# Patient Record
Sex: Male | Born: 1955 | Race: White | Hispanic: No | Marital: Married | Smoking: Former smoker
Health system: Southern US, Community
[De-identification: ages and names within clinical notes are randomized; demographics above are authoritative.]

## PROBLEM LIST (undated history)

## (undated) DIAGNOSIS — J449 Chronic obstructive pulmonary disease, unspecified: Secondary | ICD-10-CM

## (undated) DIAGNOSIS — M109 Gout, unspecified: Secondary | ICD-10-CM

## (undated) DIAGNOSIS — D869 Sarcoidosis, unspecified: Secondary | ICD-10-CM

## (undated) DIAGNOSIS — N200 Calculus of kidney: Secondary | ICD-10-CM

## (undated) DIAGNOSIS — E785 Hyperlipidemia, unspecified: Secondary | ICD-10-CM

## (undated) HISTORY — PX: NISSEN FUNDOPLICATION: SHX2091

---

## 2014-06-12 ENCOUNTER — Emergency Department (HOSPITAL_COMMUNITY): Payer: Medicare Other

## 2014-06-12 ENCOUNTER — Encounter (HOSPITAL_COMMUNITY): Payer: Self-pay | Admitting: Emergency Medicine

## 2014-06-12 ENCOUNTER — Inpatient Hospital Stay (HOSPITAL_COMMUNITY)
Admission: EM | Admit: 2014-06-12 | Discharge: 2014-06-14 | DRG: 280 | Disposition: A | Discharge status: Home / Self Care | Payer: Medicare Other | Attending: Critical Care Medicine | Admitting: Critical Care Medicine

## 2014-06-12 DIAGNOSIS — R7989 Other specified abnormal findings of blood chemistry: Secondary | ICD-10-CM

## 2014-06-12 DIAGNOSIS — M109 Gout, unspecified: Secondary | ICD-10-CM | POA: Diagnosis present

## 2014-06-12 DIAGNOSIS — J9851 Mediastinitis: Secondary | ICD-10-CM | POA: Diagnosis present

## 2014-06-12 DIAGNOSIS — I248 Other forms of acute ischemic heart disease: Secondary | ICD-10-CM | POA: Diagnosis not present

## 2014-06-12 DIAGNOSIS — J9600 Acute respiratory failure, unspecified whether with hypoxia or hypercapnia: Secondary | ICD-10-CM | POA: Diagnosis present

## 2014-06-12 DIAGNOSIS — J9601 Acute respiratory failure with hypoxia: Secondary | ICD-10-CM

## 2014-06-12 DIAGNOSIS — D869 Sarcoidosis, unspecified: Secondary | ICD-10-CM | POA: Diagnosis not present

## 2014-06-12 DIAGNOSIS — R079 Chest pain, unspecified: Secondary | ICD-10-CM

## 2014-06-12 DIAGNOSIS — R778 Other specified abnormalities of plasma proteins: Secondary | ICD-10-CM | POA: Diagnosis present

## 2014-06-12 DIAGNOSIS — Z23 Encounter for immunization: Secondary | ICD-10-CM

## 2014-06-12 DIAGNOSIS — I4510 Unspecified right bundle-branch block: Secondary | ICD-10-CM | POA: Diagnosis not present

## 2014-06-12 DIAGNOSIS — E785 Hyperlipidemia, unspecified: Secondary | ICD-10-CM | POA: Diagnosis not present

## 2014-06-12 DIAGNOSIS — K219 Gastro-esophageal reflux disease without esophagitis: Secondary | ICD-10-CM | POA: Diagnosis present

## 2014-06-12 DIAGNOSIS — Z79899 Other long term (current) drug therapy: Secondary | ICD-10-CM | POA: Diagnosis not present

## 2014-06-12 DIAGNOSIS — I10 Essential (primary) hypertension: Secondary | ICD-10-CM | POA: Diagnosis not present

## 2014-06-12 DIAGNOSIS — R0602 Shortness of breath: Secondary | ICD-10-CM | POA: Diagnosis present

## 2014-06-12 DIAGNOSIS — K920 Hematemesis: Secondary | ICD-10-CM | POA: Diagnosis present

## 2014-06-12 DIAGNOSIS — I2489 Other forms of acute ischemic heart disease: Secondary | ICD-10-CM | POA: Diagnosis present

## 2014-06-12 DIAGNOSIS — M549 Dorsalgia, unspecified: Secondary | ICD-10-CM | POA: Diagnosis not present

## 2014-06-12 DIAGNOSIS — I214 Non-ST elevation (NSTEMI) myocardial infarction: Principal | ICD-10-CM | POA: Diagnosis present

## 2014-06-12 DIAGNOSIS — R042 Hemoptysis: Secondary | ICD-10-CM

## 2014-06-12 DIAGNOSIS — G8929 Other chronic pain: Secondary | ICD-10-CM | POA: Diagnosis not present

## 2014-06-12 DIAGNOSIS — Z87891 Personal history of nicotine dependence: Secondary | ICD-10-CM | POA: Diagnosis not present

## 2014-06-12 DIAGNOSIS — R112 Nausea with vomiting, unspecified: Secondary | ICD-10-CM | POA: Diagnosis present

## 2014-06-12 DIAGNOSIS — R072 Precordial pain: Secondary | ICD-10-CM

## 2014-06-12 DIAGNOSIS — J479 Bronchiectasis, uncomplicated: Secondary | ICD-10-CM | POA: Diagnosis not present

## 2014-06-12 DIAGNOSIS — J441 Chronic obstructive pulmonary disease with (acute) exacerbation: Secondary | ICD-10-CM | POA: Diagnosis present

## 2014-06-12 HISTORY — DX: Chronic obstructive pulmonary disease, unspecified: J44.9

## 2014-06-12 HISTORY — DX: Calculus of kidney: N20.0

## 2014-06-12 HISTORY — DX: Hyperlipidemia, unspecified: E78.5

## 2014-06-12 HISTORY — DX: Gout, unspecified: M10.9

## 2014-06-12 HISTORY — DX: Sarcoidosis, unspecified: D86.9

## 2014-06-12 LAB — CK TOTAL AND CKMB (NOT AT ARMC)
CK, MB: 4.2 ng/mL — ABNORMAL HIGH (ref 0.3–4.0)
RELATIVE INDEX: INVALID (ref 0.0–2.5)
Total CK: 71 U/L (ref 7–232)

## 2014-06-12 LAB — I-STAT TROPONIN, ED: Troponin i, poc: 0.24 ng/mL (ref 0.00–0.08)

## 2014-06-12 LAB — CBC
HCT: 41.9 % (ref 39.0–52.0)
Hemoglobin: 14 g/dL (ref 13.0–17.0)
MCH: 26.6 pg (ref 26.0–34.0)
MCHC: 33.4 g/dL (ref 30.0–36.0)
MCV: 79.7 fL (ref 78.0–100.0)
Platelets: 249 10*3/uL (ref 150–400)
RBC: 5.26 MIL/uL (ref 4.22–5.81)
RDW: 13.5 % (ref 11.5–15.5)
WBC: 7.5 10*3/uL (ref 4.0–10.5)

## 2014-06-12 LAB — BASIC METABOLIC PANEL
Anion gap: 14 (ref 5–15)
BUN: 13 mg/dL (ref 6–23)
CO2: 27 mEq/L (ref 19–32)
CREATININE: 1.41 mg/dL — AB (ref 0.50–1.35)
Calcium: 9.9 mg/dL (ref 8.4–10.5)
Chloride: 100 mEq/L (ref 96–112)
GFR calc Af Amer: 63 mL/min — ABNORMAL LOW (ref >90)
GFR calc non Af Amer: 55 mL/min — ABNORMAL LOW (ref >90)
Glucose, Bld: 169 mg/dL — ABNORMAL HIGH (ref 70–99)
POTASSIUM: 3.9 meq/L (ref 3.7–5.3)
Sodium: 141 mEq/L (ref 137–147)

## 2014-06-12 LAB — TROPONIN I
Troponin I: 0.83 ng/mL (ref <0.30)
Troponin I: 2.11 ng/mL (ref <0.30)

## 2014-06-12 MED ORDER — IPRATROPIUM-ALBUTEROL 0.5-2.5 (3) MG/3ML IN SOLN
3.0000 mL | Freq: Once | RESPIRATORY_TRACT | Status: AC
  Administered 2014-06-12: 3 mL via RESPIRATORY_TRACT
  Filled 2014-06-12: qty 3

## 2014-06-12 MED ORDER — ACETAMINOPHEN 325 MG PO TABS
650.0000 mg | ORAL_TABLET | Freq: Once | ORAL | Status: AC
  Administered 2014-06-12: 650 mg via ORAL
  Filled 2014-06-12: qty 2

## 2014-06-12 MED ORDER — PANTOPRAZOLE SODIUM 40 MG IV SOLR
40.0000 mg | Freq: Two times a day (BID) | INTRAVENOUS | Status: DC
  Administered 2014-06-12 – 2014-06-14 (×4): 40 mg via INTRAVENOUS
  Filled 2014-06-12 (×5): qty 40

## 2014-06-12 MED ORDER — METHYLPREDNISOLONE SODIUM SUCC 125 MG IJ SOLR
60.0000 mg | Freq: Three times a day (TID) | INTRAMUSCULAR | Status: DC
  Administered 2014-06-12: 60 mg via INTRAVENOUS
  Filled 2014-06-12: qty 2

## 2014-06-12 MED ORDER — IOHEXOL 350 MG/ML SOLN
100.0000 mL | Freq: Once | INTRAVENOUS | Status: AC | PRN
  Administered 2014-06-12: 100 mL via INTRAVENOUS

## 2014-06-12 MED ORDER — ASPIRIN 81 MG PO CHEW
CHEWABLE_TABLET | ORAL | Status: AC
  Filled 2014-06-12: qty 4

## 2014-06-12 MED ORDER — NITROGLYCERIN IN D5W 200-5 MCG/ML-% IV SOLN
10.0000 ug/min | INTRAVENOUS | Status: DC
  Administered 2014-06-12: 5 ug/min via INTRAVENOUS

## 2014-06-12 MED ORDER — LEVOFLOXACIN 750 MG PO TABS: 750.0000 mg | ORAL_TABLET | Freq: Every day | ORAL | Status: DC

## 2014-06-12 MED ORDER — ASPIRIN 325 MG PO TABS
325.0000 mg | ORAL_TABLET | Freq: Once | ORAL | Status: AC
  Administered 2014-06-12: 325 mg via ORAL
  Filled 2014-06-12: qty 1

## 2014-06-12 MED ORDER — LEVOFLOXACIN 750 MG PO TABS
750.0000 mg | ORAL_TABLET | Freq: Once | ORAL | Status: DC
  Filled 2014-06-12: qty 1

## 2014-06-12 MED ORDER — NITROGLYCERIN 0.4 MG SL SUBL
0.4000 mg | SUBLINGUAL_TABLET | SUBLINGUAL | Status: AC | PRN
  Administered 2014-06-12 (×3): 0.4 mg via SUBLINGUAL
  Filled 2014-06-12 (×3): qty 1

## 2014-06-12 MED ORDER — NITROGLYCERIN IN D5W 200-5 MCG/ML-% IV SOLN
10.0000 ug/min | Freq: Once | INTRAVENOUS | Status: DC
  Filled 2014-06-12: qty 250

## 2014-06-12 NOTE — ED Notes (Signed)
X-ray notified of priority for this pt.

## 2014-06-12 NOTE — ED Notes (Addendum)
CODE STEMI CALLED. 

## 2014-06-12 NOTE — ED Notes (Signed)
Internal medicine at bedside

## 2014-06-12 NOTE — ED Notes (Addendum)
Cath pads placed, pt on zoll

## 2014-06-12 NOTE — ED Provider Notes (Signed)
Medical screening examination/treatment/procedure(s) were conducted as a shared visit with non-physician practitioner(s) and myself.  I personally evaluated the patient during the encounter.   EKG Interpretation   Date/Time:  Saturday June 12 2014 15:17:03 EDT Ventricular Rate:  106 PR Interval:  161 QRS Duration: 128 QT Interval:  348 QTC Calculation: 462 R Axis:   -113 Text Interpretation:  Sinus tachycardia RBBB and LAFB Inferior infarct,  acute Lateral leads are also involved No previous ECGs available except  for the one done earlier just now Confirmed by Platon Arocho  MD, Sheehan Stacey  [phone]) on 06/12/2014 3:21:43 PM      Results for orders placed during the hospital encounter of 06/12/14  CBC      Result Value Ref Range   WBC 7.5  4.0 - 10.5 K/uL   RBC 5.26  4.22 - 5.81 MIL/uL   Hemoglobin 14.0  13.0 - 17.0 g/dL   HCT 30.8  65.7 - 84.6 %   MCV 79.7  78.0 - 100.0 fL   MCH 26.6  26.0 - 34.0 pg   MCHC 33.4  30.0 - 36.0 g/dL   RDW 96.2  95.2 - 84.1 %   Platelets 249  150 - 400 K/uL  I-STAT TROPOININ, ED      Result Value Ref Range   Troponin i, poc 0.24 (*) 0.00 - 0.08 ng/mL   Comment NOTIFIED PHYSICIAN     Comment 3            CRITICAL CARE Performed by: Siddhartha Hoback Total critical care time: 30 Critical care time was exclusive of separately billable procedures and treating other patients. Critical care was necessary to treat or prevent imminent or life-threatening deterioration. Critical care was time spent personally by me on the following activities: development of treatment plan with patient and/or surrogate as well as nursing, discussions with consultants, evaluation of patient's response to treatment, examination of patient, obtaining history from patient or surrogate, ordering and performing treatments and interventions, ordering and review of laboratory studies, ordering and review of radiographic studies, pulse oximetry and re-evaluation of patient's  condition.  Patient brought in by EMS. Sudden episode acute onset of vomiting some blood small pool since spots of blood. And then very shortly after that within seconds.right-sided chest pain. Patient continued on do do some vomiting and lots of shortness of breath with it. EKG upon arrival was concerning for ST segment elevation consistent with an MI in lead 1 V2. ST segment depression in V3 only. Patient's initial point-of-care troponin elevated. Patient's chest pain improved with nitroglycerin.  Chest x-ray portable markedly abnormal on reviewed by [place]. We asked radiology to take a look at the chest x-ray. 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 Patient has been followed at [facility] for this in the past and has had surgery done there. Patient also reports having a cardiac cath about 2 years ago go to have no significant coronary disease.  Based on the persistent elevation of V1 V2 code STEMI was initiated discussed with cardiology to do his bleeding they did not feel comfortable going to the Cath Lab in addition the findings and V1 V2 were a little bit soft. They are aware that his troponin was elevated.  Based on the chest x-ray findings we'll induce CT chest to further evaluate the lung findings.  Vanetta Mulders, MD 06/12/14 1623

## 2014-06-12 NOTE — H&P (Addendum)
Patient's PCP: [facility] Complaint: Nausea, vomiting, coughing blood, chest pain, and shortness of breath  History of Present Illness: Tony Wise is a 58 y.o. Caucasian male male with history of sarcoidosis, COPD, GERD, nephrolithiasis, and gout who presents with the above complaints.  Patient gets most of his care at [facility], he lives in [place], [place]. He came to [place], [place] to shop.  At 2:15 - 2:30 PM, he had a sudden episode of nausea and vomiting. He did not expel his emesis and attempted to swallow it.  Then he had a 5-10 min episode of sever cough and short of breath. During this episode he started coughing bright red blood, which patient reports that it was almost a liter.  He also started having chest pain.  His wife called 911 and he was brought to the emergency department by EMS.  Initially his troponin was elevated with questionable ST changes, he was started on IV nitroglycerin.  His chest pain improved somewhat with nitroglycerin.  CT of the chest with contrast was done which was negative for pulmonary embolism.  Initially a code STEMI was activated, case was discussed with Dr. Eldridge Dace and it was felt that patient was not a candidate for emergent cardiac catheter due to non-convincing EKG changes, hemoptysis and possible hematemesis.  Hospitalist service was asked to admit the patient for further care and management.  He denies any fevers but reports having chills and night sweats at home.  Denies any abdominal pain, or diarrhea.  Does have chronic loose stools.  Denies any headaches.  Review of Systems: All systems reviewed with the patient and positive as per history of present illness, otherwise all other systems are negative.  Past Medical History  Diagnosis Date  . COPD (chronic obstructive pulmonary disease)   . Hyperlipidemia   . Sarcoidosis   . Gout    History reviewed. No pertinent past surgical history. Family History  Problem Relation  Age of Onset  . Diabetes Mother   . Aneurysm Father   . Other Father     Black lung   History   Social History  . Marital Status: Married    Spouse Name: N/A    Number of Children: N/A  . Years of Education: N/A   Occupational History  . Not on file.   Social History Main Topics  . Smoking status: Former Smoker    Quit date: 06/12/2006  . Smokeless tobacco: Not on file  . Alcohol Use: Not on file  . Drug Use: No  . Sexual Activity: Not on file   Other Topics Concern  . Not on file   Social History Narrative  . No narrative on file   Allergies: Review of patient's allergies indicates no known allergies.  Home Meds: Prior to Admission medications   Medication Sig Start Date End Date Taking? Authorizing Provider  albuterol (PROVENTIL) (2.5 MG/3ML) 0.083% nebulizer solution Take 2.5 mg by nebulization every 6 (six) hours as needed for wheezing or shortness of breath.   Yes Historical Provider, MD  albuterol-ipratropium (COMBIVENT) 18-103 MCG/ACT inhaler Inhale 1 puff into the lungs every 6 (six) hours as needed for wheezing or shortness of breath.   Yes Historical Provider, MD  fluticasone (FLONASE) 50 MCG/ACT nasal spray Place 1 spray into both nostrils daily.   Yes Historical Provider, MD  lisinopril (PRINIVIL,ZESTRIL) 10 MG tablet Take 5 mg by mouth daily.   Yes Historical Provider, MD  Multiple Vitamins-Minerals (MULTIVITAMIN PO) Take 1  tablet by mouth daily.   Yes Historical Provider, MD    Physical Exam: Blood pressure 104/71, pulse 69, temperature 97.8 F (36.6 C), temperature source Oral, resp. rate 16, SpO2 96.00%. General: Awake, Oriented x3, No acute distress. HEENT: EOMI, Moist mucous membranes Neck: Supple CV: S1 and S2, some reproducible chest pain on palpation over the sternum Lungs: Moderate air movement, inspiratory and expiratory wheezing bilaterally. Abdomen: Soft, Nontender, Nondistended, +bowel sounds. Ext: Good pulses. Trace edema. No clubbing or  cyanosis noted. Neuro: Cranial Nerves II-XII grossly intact. Has 5/5 motor strength in upper and lower extremities.  Lab results:  Recent Labs  06/12/14 1530  NA 141  K 3.9  CL 100  CO2 27  GLUCOSE 169*  BUN 13  CREATININE 1.41*  CALCIUM 9.9   No results found for this basename: AST, ALT, ALKPHOS, BILITOT, PROT, ALBUMIN,  in the last 72 hours No results found for this basename: LIPASE, AMYLASE,  in the last 72 hours  Recent Labs  06/12/14 1530  WBC 7.5  HGB 14.0  HCT 41.9  MCV 79.7  PLT 249    Recent Labs  06/12/14 1530 06/12/14 1917  CKTOTAL  --  71  CKMB  --  4.2*  TROPONINI 0.83* 2.11*   No components found with this basename: POCBNP,  No results found for this basename: DDIMER,  in the last 72 hours No results found for this basename: HGBA1C,  in the last 72 hours No results found for this basename: CHOL, HDL, LDLCALC, TRIG, CHOLHDL, LDLDIRECT,  in the last 72 hours No results found for this basename: TSH, T4TOTAL, FREET3, T3FREE, THYROIDAB,  in the last 72 hours No results found for this basename: VITAMINB12, FOLATE, FERRITIN, TIBC, IRON, RETICCTPCT,  in the last 72 hours Imaging results:  Ct Chest Wo Contrast  06/12/2014   CLINICAL DATA:  Chest pain. Shortness of breath. Hemoptysis. Abnormal chest x-ray earlier today.  EXAM: CT CHEST WITHOUT CONTRAST  TECHNIQUE: Multidetector CT imaging of the chest was performed following the standard protocol without IV contrast.  COMPARISON:  No prior CT.  Portable chest x-ray earlier same date.  FINDINGS: Innumerable nodules throughout both lungs. Bronchiectasis and scarring associated with parenchymal calcification involving both upper lobes, accounting for the opacity on the chest x-ray. Emphysematous changes in both lungs. Small left pleural effusion. Enlarged calcified lymph nodes throughout the mediastinum and in both hila, the largest conglomerate nodes measuring approximately 3.2 x 6.1 cm in the subcarinal region  (station 7).  Normal heart size. Mild LAD and left circumflex coronary atherosclerosis. Mild atherosclerosis involving the thoracic and upper abdominal aorta without aneurysm.  Bone window images demonstrate spondylosis at T8-9 and perhaps mild osseous demineralization. Visualized upper abdomen unremarkable for the unenhanced technique.  IMPRESSION: 1. 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 This is typically seen in patients with dust inhalational pneumoconioses such as coal workers pneumoconiosis, silicosis and occasionally talcosis. 2. Mediastinal and bilateral hilar lymphadenopathy with calcifications as part of the same pathologic process.   Electronically Signed   By: Hulan Saas M.D.   On: 06/12/2014 17:01   Ct Angio Chest W/cm &/or Wo Cm  06/12/2014   CLINICAL DATA:  Sudden onset of nausea. Vomiting bright red blood. Chest pain. History of sarcoidosis and COPD.  EXAM: CT ANGIOGRAPHY CHEST WITH CONTRAST  TECHNIQUE: Multidetector CT imaging of the chest  was performed using the standard protocol during bolus administration of intravenous contrast. Multiplanar CT image reconstructions and MIPs were obtained to evaluate the vascular anatomy.  CONTRAST:  OMNIPAQUE IOHEXOL 350 MG/ML SOLN  COMPARISON:  06/12/2014 at 1642 hr.  FINDINGS: No evidence of a pulmonary embolus.  Heart is normal in size.  Minor coronary artery calcifications.  There is bulky partly calcified mediastinal adenopathy as well as bulky hilar adenopathy also partly calcified. Hilar adenopathy is contiguous with progressive massive fibrosis and associated calcifications in both upper lobes.  There are numerous small pulmonary nodules many with calcifications.  Bulky fibrosis narrows the upper lobe pulmonary arteries and bronchi. Significant fibrosis and  volume loss is noted in the right middle lobe, also with areas of bronchial narrowing.  No pleural effusion.  No pneumothorax.  Limited evaluation of the upper abdomen is unremarkable.  No osteoblastic or osteolytic lesions.  Review of the MIP images confirms the above findings.  IMPRESSION: 1. No evidence of a pulmonary embolism. 2. As seen on the study obtained earlier the same date, there is progressive massive fibrosis, numerous pulmonary nodules, bulky mediastinal and hilar adenopathy, with scattered calcifications noted throughout the nodes in pulmonary nodules and upper lobe fibrosis. Although the history provided indicates sarcoidosis, other etiologies as mentioned in the early report including silicosis should be considered. 3. There are areas of bronchial and pulmonary artery narrowing mostly in the upper lobes. The possibility of the fibrosis eroding into a bronchus in leading to bleeding should be considered given the history.   Electronically Signed   By: Amie Portland M.D.   On: 06/12/2014 19:54   Dg Chest Portable 1 View  06/12/2014   CLINICAL DATA:  Right-sided chest pain and shortness of breath. Nausea. History of COPD and sarcoid.  EXAM: PORTABLE CHEST - 1 VIEW  COMPARISON:  None.  FINDINGS: Cardiac and mediastinal contours are largely obscured due to overlying opacities. Upper retraction of the hila bilaterally. There is large consolidative opacity within the right greater than left upper lungs. Diffuse bilateral scattered nodules. No pleural effusion. No definite pneumothorax. Low lung volumes. Regional skeleton is unremarkable. Relative lucency under left hemidiaphragm.  IMPRESSION: Right-greater-than-left upper lung large consolidative pulmonary opacities as well as diffuse bilateral pulmonary nodules. In the absence of priors for comparison, findings may be secondary to sarcoidosis and associated progressive massive fibrosis. Underlying pulmonary malignancy or superimposed infectious  process are additional considerations. Correlation with outside priors would prove helpful.  Small right pleural effusion versus thickening.  Relative lucency under left hemidiaphragm favored to represent gas within the colon. If there is concern for intra-abdominal pathology, can correlate with decubitus views to exclude free air.  These results were called by telephone at the time of interpretation on 06/12/2014 at 4:20 pm to [facility] , who verbally acknowledged these results.   Electronically Signed   By: Annia Belt M.D.   On: 06/12/2014 16:24    Assessment & Plan by Problem: Elevated troponin Unclear etiology.  Cardiology not convinced that patient has acute coronary syndrome, even though troponin is elevated creatinine kinase is normal.  Case discussed with Dr. Mayford Knife, cardiology.  Patient at this time is not a candidate for anticoagulation given hemoptysis and possibly hematemesis.  Cardiology following, appreciate their input.  Requested echocardiogram in the morning.  Patient received a dose of aspirin in the emergency department.  Given hemoptysis, hold aspirin for now.  Continue to cycle troponin and CK.  Check lipid panel and hemoglobin A1c  in the morning to risk stratify the patient.  Acute respiratory failure with COPD exacerbation Start Solu-Medrol 60 mg every 8 hours IV.  Start levofloxacin for COPD exacerbation and also to cover for aspiration pneumonitis.  Continue nebulizer therapy scheduled and as needed.  Chest pain Monitor patient on telemetry.  Cycle cardiac enzymes.  Hemoptysis CT was done which was negative for pulmonary embolism.  Pulmonary was consulted for further care and management.  Cycle CBC.  No further episodes of bleeding.  Possible hematemesis N.p.o.  Cycle CBC.  Start pantoprazole 40 mg IV twice daily.  GERD Continue PPI.  Hypertension Continue lisinopril with hold parameters.  Prophylaxis SCDs.  CODE STATUS Full code.  Disposition Admit the  patient to step down for close observation on telemetry.  Time spent on admission, talking to the patient, and coordinating care was: 50 mins.  Tyke Outman A, MD 06/12/2014, 10:10 PM  Discussed with critical care service.  They wish to see him patient's care and take the patient to the ICU for closer monitoring.  Case discussed with Dr. Kem Kays, critical care.  Janeil Schexnayder A, MD 06/12/2014, 11:32 PM

## 2014-06-12 NOTE — ED Provider Notes (Signed)
CSN: [id number]     Arrival date & time 06/12/14  1500 History   First MD Initiated Contact with Patient 06/12/14 1502     Chief Complaint  Patient presents with  . Chest Pain  . Shortness of Breath     (Consider location/radiation/quality/duration/timing/severity/associated sxs/prior Treatment) HPI Comments: Pt is a 58 y/o male with a PMHx of sarcoidosis, COPD and hyperlipidemia who presents to the ED via EMS complaining of sudden onset nausea, vomiting, chest pain and sob beginning about 1 hour prior to arrival when he was walking in the store. Pt reports he was feeling fine, and he suddenly began to feel nauseated, immediately vomited BRB, and then developed right sided chest pain with associated left shoulder pain and shortness of breath within seconds. EMS arrived and noted some wheezing and gave him a neb treatment. Pt denies hx of hematemesis, however states in his prior surgeries (hx of lung surgeries) he was told he was a difficult intubation. Denies ever having chest pain like this in the past. No other medications given via EMS. Denies numbness or weakness, lightheadedness, dizziness, abdominal pain. EKG on arrival showed possible elevated ST inferiorly and laterally. Pt had normal cardiac cath 2 years ago. No hx of MI. Denies hx of esophageal varices or GI bleeding.  Patient is a 58 y.o. male presenting with chest pain and shortness of breath. The history is provided by the patient and the EMS personnel.  Chest Pain Associated symptoms: nausea, shortness of breath and vomiting   Shortness of Breath Associated symptoms: chest pain and vomiting     Past Medical History  Diagnosis Date  . COPD (chronic obstructive pulmonary disease)   . Hyperlipidemia   . Sarcoidosis   . Gout    History reviewed. No pertinent past surgical history. Family History  Problem Relation Age of Onset  . Diabetes Mother   . Aneurysm Father   . Other Father     Black lung   History  Substance Use  Topics  . Smoking status: Former Smoker    Quit date: 06/12/2006  . Smokeless tobacco: Not on file  . Alcohol Use: Not on file    Review of Systems  Respiratory: Positive for shortness of breath.   Cardiovascular: Positive for chest pain.  Gastrointestinal: Positive for nausea and vomiting.  All other systems reviewed and are negative.     Allergies  Review of patient's allergies indicates no known allergies.  Home Medications   Prior to Admission medications   Medication Sig Start Date End Date Taking? Authorizing Provider  albuterol (PROVENTIL) (2.5 MG/3ML) 0.083% nebulizer solution Take 2.5 mg by nebulization every 6 (six) hours as needed for wheezing or shortness of breath.   Yes Historical Provider, MD  albuterol-ipratropium (COMBIVENT) 18-103 MCG/ACT inhaler Inhale 1 puff into the lungs every 6 (six) hours as needed for wheezing or shortness of breath.   Yes Historical Provider, MD  fluticasone (FLONASE) 50 MCG/ACT nasal spray Place 1 spray into both nostrils daily.   Yes Historical Provider, MD  lisinopril (PRINIVIL,ZESTRIL) 10 MG tablet Take 5 mg by mouth daily.   Yes Historical Provider, MD  Multiple Vitamins-Minerals (MULTIVITAMIN PO) Take 1 tablet by mouth daily.   Yes Historical Provider, MD   BP 118/69  Pulse 72  Temp(Src) 97.8 F (36.6 C) (Oral)  Resp 12  SpO2 97% Physical Exam  Nursing note and vitals reviewed. Constitutional: He is oriented to person, place, and time. He appears well-developed and well-nourished. No distress.  HENT:  Head: Normocephalic and atraumatic.  Mouth/Throat: Oropharynx is clear and moist.  Eyes: Conjunctivae and EOM are normal. Pupils are equal, round, and reactive to light.  Neck: Normal range of motion. Neck supple. No JVD present.  Cardiovascular: Regular rhythm, normal heart sounds and intact distal pulses.   Tachycardic. No extremity edema.  Pulmonary/Chest: Effort normal. No respiratory distress.  Scattered wheezes  bilateral.  Abdominal: Soft. Bowel sounds are normal. He exhibits no distension. There is no tenderness.  Musculoskeletal: Normal range of motion. He exhibits no edema.  Neurological: He is alert and oriented to person, place, and time. He has normal strength. No sensory deficit.  Speech fluent, goal oriented. Moves limbs without ataxia. Equal grip strength bilateral.  Skin: Skin is warm and dry. He is not diaphoretic.  Psychiatric: He has a normal mood and affect. His behavior is normal.    ED Course  Procedures (including critical care time)  CRITICAL CARE Performed by: Johnnette Gourd   Total critical care time: 60 minutes  Critical care time was exclusive of separately billable procedures and treating other patients.  Critical care was necessary to treat or prevent imminent or life-threatening deterioration.  Critical care was time spent personally by me on the following activities: development of treatment plan with patient and/or surrogate as well as nursing, discussions with consultants, evaluation of patient's response to treatment, examination of patient, obtaining history from patient or surrogate, ordering and performing treatments and interventions, ordering and review of laboratory studies, ordering and review of radiographic studies, pulse oximetry and re-evaluation of patient's condition.  Labs Review Labs Reviewed  BASIC METABOLIC PANEL - Abnormal; Notable for the following:    Glucose, Bld 169 (*)    Creatinine, Ser 1.41 (*)    GFR calc non Af Amer 55 (*)    GFR calc Af Amer 63 (*)    All other components within normal limits  TROPONIN I - Abnormal; Notable for the following:    Troponin I 0.83 (*)    All other components within normal limits  TROPONIN I - Abnormal; Notable for the following:    Troponin I 2.11 (*)    All other components within normal limits  CK TOTAL AND CKMB - Abnormal; Notable for the following:    CK, MB 4.2 (*)    All other components  within normal limits  I-STAT TROPOININ, ED - Abnormal; Notable for the following:    Troponin i, poc 0.24 (*)    All other components within normal limits  CBC  TROPONIN I  TROPONIN I  CK  CK    Imaging Review Ct Chest Wo Contrast  06/12/2014   CLINICAL DATA:  Chest pain. Shortness of breath. Hemoptysis. Abnormal chest x-ray earlier today.  EXAM: CT CHEST WITHOUT CONTRAST  TECHNIQUE: Multidetector CT imaging of the chest was performed following the standard protocol without IV contrast.  COMPARISON:  No prior CT.  Portable chest x-ray earlier same date.  FINDINGS: Innumerable nodules throughout both lungs. Bronchiectasis and scarring associated with parenchymal calcification involving both upper lobes, accounting for the opacity on the chest x-ray. Emphysematous changes in both lungs. Small left pleural effusion. Enlarged calcified lymph nodes throughout the mediastinum and in both hila, the largest conglomerate nodes measuring approximately 3.2 x 6.1 cm in the subcarinal region (station 7).  Normal heart size. Mild LAD and left circumflex coronary atherosclerosis. Mild atherosclerosis involving the thoracic and upper abdominal aorta without aneurysm.  Bone window images demonstrate spondylosis at T8-9 and  perhaps mild osseous demineralization. Visualized upper abdomen unremarkable for the unenhanced technique.  IMPRESSION: 1. 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 This is typically seen in patients with dust inhalational pneumoconioses such as coal workers pneumoconiosis, silicosis and occasionally talcosis. 2. Mediastinal and bilateral hilar lymphadenopathy with calcifications as part of the same pathologic process.   Electronically Signed   By: Hulan Saas M.D.   On: 06/12/2014 17:01   Ct Angio Chest W/cm &/or Wo  Cm  06/12/2014   CLINICAL DATA:  Sudden onset of nausea. Vomiting bright red blood. Chest pain. History of sarcoidosis and COPD.  EXAM: CT ANGIOGRAPHY CHEST WITH CONTRAST  TECHNIQUE: Multidetector CT imaging of the chest was performed using the standard protocol during bolus administration of intravenous contrast. Multiplanar CT image reconstructions and MIPs were obtained to evaluate the vascular anatomy.  CONTRAST:  OMNIPAQUE IOHEXOL 350 MG/ML SOLN  COMPARISON:  06/12/2014 at 1642 hr.  FINDINGS: No evidence of a pulmonary embolus.  Heart is normal in size.  Minor coronary artery calcifications.  There is bulky partly calcified mediastinal adenopathy as well as bulky hilar adenopathy also partly calcified. Hilar adenopathy is contiguous with progressive massive fibrosis and associated calcifications in both upper lobes.  There are numerous small pulmonary nodules many with calcifications.  Bulky fibrosis narrows the upper lobe pulmonary arteries and bronchi. Significant fibrosis and volume loss is noted in the right middle lobe, also with areas of bronchial narrowing.  No pleural effusion.  No pneumothorax.  Limited evaluation of the upper abdomen is unremarkable.  No osteoblastic or osteolytic lesions.  Review of the MIP images confirms the above findings.  IMPRESSION: 1. No evidence of a pulmonary embolism. 2. As seen on the study obtained earlier the same date, there is progressive massive fibrosis, numerous pulmonary nodules, bulky mediastinal and hilar adenopathy, with scattered calcifications noted throughout the nodes in pulmonary nodules and upper lobe fibrosis. Although the history provided indicates sarcoidosis, other etiologies as mentioned in the early report including silicosis should be considered. 3. There are areas of bronchial and pulmonary artery narrowing mostly in the upper lobes. The possibility of the fibrosis eroding into a bronchus in leading to bleeding should be considered given the  history.   Electronically Signed   By: Amie Portland M.D.   On: 06/12/2014 19:54   Dg Chest Portable 1 View  06/12/2014   CLINICAL DATA:  Right-sided chest pain and shortness of breath. Nausea. History of COPD and sarcoid.  EXAM: PORTABLE CHEST - 1 VIEW  COMPARISON:  None.  FINDINGS: Cardiac and mediastinal contours are largely obscured due to overlying opacities. Upper retraction of the hila bilaterally. There is large consolidative opacity within the right greater than left upper lungs. Diffuse bilateral scattered nodules. No pleural effusion. No definite pneumothorax. Low lung volumes. Regional skeleton is unremarkable. Relative lucency under left hemidiaphragm.  IMPRESSION: Right-greater-than-left upper lung large consolidative pulmonary opacities as well as diffuse bilateral pulmonary nodules. In the absence of priors for comparison, findings may be secondary to sarcoidosis and associated progressive massive fibrosis. Underlying pulmonary malignancy or superimposed infectious process are additional considerations. Correlation with outside priors would prove helpful.  Small right pleural effusion versus thickening.  Relative lucency under left hemidiaphragm favored to represent gas within the colon. If there is concern for intra-abdominal pathology, can correlate with decubitus views to exclude free  air.  These results were called by telephone at the time of interpretation on 06/12/2014 at 4:20 pm to [facility] , who verbally acknowledged these results.   Electronically Signed   By: Annia Belt M.D.   On: 06/12/2014 16:24     EKG Interpretation   Date/Time:  Saturday June 12 2014 15:17:03 EDT Ventricular Rate:  106 PR Interval:  161 QRS Duration: 128 QT Interval:  348 QTC Calculation: 462 R Axis:   -113 Text Interpretation:  Sinus tachycardia RBBB and LAFB Inferior infarct,  acute Lateral leads are also involved No previous ECGs available except  for the one done earlier just now  Confirmed by ZACKOWSKI  MD, SCOTT  [phone]) on 06/12/2014 3:21:43 PM      MDM   Final diagnoses:  NSTEMI (non-ST elevated myocardial infarction)  Hemoptysis  Shortness of breath   Pt with CP, SOB, hematemesis. Small amount of BRB in emesis bag. EKG on arrival concerning for ST elevation in lead II and V2. ST depression in lead V3. Repeat EKG unchanged. POC troponin elevated at 0.24. CODE STEMI initiated, Dr. Deretha Emory spoke with Dr. Eldridge Dace who does not feel comfortable going to cath lab given hematemesis, and elevation is not significant. Advised nitro drip. No heparin given the hematemesis. Dr. Elease Hashimoto also came to ED, does not feel this is a STEMI, advised repeat troponin (regular lab). I discussed CXR findings with radiologist. Will obtain CT chest for further evaluation. Pt received ASA and 1 SL nitro with relief from 10/10 to 3-4/10.  Chest CT showing severe progressive massive fibrosis with associated calcifications and bronchiectasis in the upper lobe, along with innumerable nodules throughout lungs. Mediastinal and bilateral hilar lymphadenopathy with calcifications as part of the same pathologic process.  Regular lab troponin elevated at 0.83. I again consult with cardiology, spoke with Dr. Mayford Knife who does not feel the elevated troponin is coming from a cardiac issue. She advised obtaining CT angiogram evaluate for PE. CT angiogram obtained, no evidence of PE. Serial troponins ordered by cardiology, results keep trending upwards, results to 0.11. I again spoke with Dr. Mayford Knife who still does not feel this is a cardiac reason for elevated troponin. Pt admitted to hospitalist, Dr. Betti Cruz to step-down.  Trevor Mace, PA-C 06/12/14 2247

## 2014-06-12 NOTE — Consult Note (Signed)
Admit date: 06/12/2014 Referring Physician  Dr. Deretha Emory Primary Cardiologist  None Reason for Consultation  Right sided pleuritic CP and elevated trop  HPI: This is a 57yo WM with a no history of CAD but a history of dyslipidemia and COPD with sarcoidosis where he is followed by [facility].  He has not had any anginal chest pain in the past but has chronic SOB from his lung disease.  He was in his [place] today out shopping when he felt nauseated and then started vomiting profusely.  He started coughing up blood and then throwing it up as well as blood coming out his nose.  He then suddenly started wheezing and could not breath.  Next he had sudden onset of sharp pleuritic chest pain much worse with deep breathing and a 10/10.  He denied any diaphoresis.  He denied any recent fever, chills or abdominal pain.  He was noted to have blood in the emesis and the patient says that he was coughing up blood as well.  He continued to have vomiting with severe SOB.  In ER he had a EKG with ? ST elevation in V2 with depression in V3 and was started on IV NTG.  His CP did improve with IV NTG.  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  The patient admits to cardiac cath 2 years ago that was normal.  Due to ST elevation in V1 and V2 a code STEMI was initiated and case discussed with Dr. Eldridge Dace and felt not to be a candidate for emergent cath due to hemoptysis and hematemesis.  Ct showed mild LAD and left circumflex coronary atherosclerosis and mild atherosclerosis involving the thoracic and upper abdominal aorta without aneurysm.  There was severe progressive massive fibrosis (PMF) with associated calcifications and bronchiectasis in the upper lobes, along with innumerable nodules throughout both lungs, accounting for the opacities on the earlier chest x-ray. Initial serum troponin was .83 and POC trop .24.  Cardiology is now asked to consult.  The patient still has 2/10 CP that is  right sided and only occurs with breathing.     PMH:   Past Medical History  Diagnosis Date  . COPD (chronic obstructive pulmonary disease)   . Hyperlipidemia      PSH:  History reviewed. No pertinent past surgical history.  Allergies:  Review of patient's allergies indicates no known allergies. Prior to Admit Meds:   (Not in a hospital admission) Fam HX:   History reviewed. No pertinent family history. Social HX:    History   Social History  . Marital Status: Married    Spouse Name: N/A    Number of Children: N/A  . Years of Education: N/A   Occupational History  . Not on file.   Social History Main Topics  . Smoking status: Not on file  . Smokeless tobacco: Not on file  . Alcohol Use: Not on file  . Drug Use: Not on file  . Sexual Activity: Not on file   Other Topics Concern  . Not on file   Social History Narrative  . No narrative on file     ROS:  All 11 ROS were addressed and are negative except what is stated in the HPI  Physical Exam: Blood pressure 99/71, pulse 79, temperature 97.8 F (36.6 C), temperature source Oral, resp. rate 12, SpO2 99.00%.    General: Well developed, well nourished, in no acute distress Head: Eyes PERRLA, No xanthomas.  Normal cephalic and atramatic  Lungs:   Severe expiratory wheezes and rhonchi throughout Heart:   HRRR S1 S2 Pulses are 2+ & equal.            No carotid bruit. No JVD.  No abdominal bruits. No femoral bruits. Abdomen: Bowel sounds are positive, abdomen soft and non-tender without masses Extremities:   No clubbing, cyanosis or edema.  DP +1 Neuro: Alert and oriented X 3. Psych:  Good affect, responds appropriately    Labs:   Lab Results  Component Value Date   WBC 7.5 06/12/2014   HGB 14.0 06/12/2014   HCT 41.9 06/12/2014   MCV 79.7 06/12/2014   PLT 249 06/12/2014    Recent Labs Lab 06/12/14 1530  NA 141  K 3.9  CL 100  CO2 27  BUN 13  CREATININE 1.41*  CALCIUM 9.9  GLUCOSE 169*   No results  found for this basename: PTT   No results found for this basename: INR, PROTIME   Lab Results  Component Value Date   TROPONINI 0.83* 06/12/2014     No results found for this basename: CHOL   No results found for this basename: HDL   No results found for this basename: LDLCALC   No results found for this basename: TRIG   No results found for this basename: CHOLHDL   No results found for this basename: LDLDIRECT      Radiology:  Ct Chest Wo Contrast  06/12/2014   CLINICAL DATA:  Chest pain. Shortness of breath. Hemoptysis. Abnormal chest x-ray earlier today.  EXAM: CT CHEST WITHOUT CONTRAST  TECHNIQUE: Multidetector CT imaging of the chest was performed following the standard protocol without IV contrast.  COMPARISON:  No prior CT.  Portable chest x-ray earlier same date.  FINDINGS: Innumerable nodules throughout both lungs. Bronchiectasis and scarring associated with parenchymal calcification involving both upper lobes, accounting for the opacity on the chest x-ray. Emphysematous changes in both lungs. Small left pleural effusion. Enlarged calcified lymph nodes throughout the mediastinum and in both hila, the largest conglomerate nodes measuring approximately 3.2 x 6.1 cm in the subcarinal region (station 7).  Normal heart size. Mild LAD and left circumflex coronary atherosclerosis. Mild atherosclerosis involving the thoracic and upper abdominal aorta without aneurysm.  Bone window images demonstrate spondylosis at T8-9 and perhaps mild osseous demineralization. Visualized upper abdomen unremarkable for the unenhanced technique.  IMPRESSION: 1. 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 This is typically seen in patients with dust inhalational pneumoconioses such as coal workers pneumoconiosis, silicosis and  occasionally talcosis. 2. Mediastinal and bilateral hilar lymphadenopathy with calcifications as part of the same pathologic process.   Electronically Signed   By: Hulan Saas M.D.   On: 06/12/2014 17:01   Dg Chest Portable 1 View  06/12/2014   CLINICAL DATA:  Right-sided chest pain and shortness of breath. Nausea. History of COPD and sarcoid.  EXAM: PORTABLE CHEST - 1 VIEW  COMPARISON:  None.  FINDINGS: Cardiac and mediastinal contours are largely obscured due to overlying opacities. Upper retraction of the hila bilaterally. There is large consolidative opacity within the right greater than left upper lungs. Diffuse bilateral scattered nodules. No pleural effusion. No definite pneumothorax. Low lung volumes. Regional skeleton is unremarkable. Relative lucency under left hemidiaphragm.  IMPRESSION: Right-greater-than-left upper lung large consolidative pulmonary opacities as well as diffuse bilateral  pulmonary nodules. In the absence of priors for comparison, findings may be secondary to sarcoidosis and associated progressive massive fibrosis. Underlying pulmonary malignancy or superimposed infectious process are additional considerations. Correlation with outside priors would prove helpful.  Small right pleural effusion versus thickening.  Relative lucency under left hemidiaphragm favored to represent gas within the colon. If there is concern for intra-abdominal pathology, can correlate with decubitus views to exclude free air.  These results were called by telephone at the time of interpretation on 06/12/2014 at 4:20 pm to [facility] , who verbally acknowledged these results.   Electronically Signed   By: Annia Belt M.D.   On: 06/12/2014 16:24    EKG:  NSR with RBBB and no ST elevation, slight ST depression in V3  ASSESSMENT:  1.  Elevated troponin which is minimally elevated and most likely nonspecific elevation from severe vomiting.  He does have mild coronary artery calcifications on chest CT  but he has never had chest pain and had a cath 2 years ago that was normal.  I have personally reviewed the EKG and it shows RBBB with no ST elevation to indicate acute MI.  His pain is very atypical in that it is pleuritic and occurs mainly with deep breathing.  It started after a severe coughing fit after vomiting.  I suspect he aspirated some when he vomited triggering the severe coughing and chest pain.   2.  Atypical chest pain - as stated above.  I do not think this is an acute coronary syndrome. 3.  Severe COPD with ? End stage sarcoidosis followed at [facility] - exam shows severe wheezing and rhonchi throughout 4.  Dyslipidemia.  PLAN:   1. Continue to cycle cardiac enzymes and check CPK and MB 2. No anticoagulation with heparin or ASA at this time due to hemoptysis and hematemesis. 3.  Consider chest CT with contrast to rule out PE 4.  No BB secondary to severe wheezing 5.  Need to determine bleeding source (GI vs. Pulmonary) 4.  Will follow with you  Quintella Reichert, MD  06/12/2014  6:45 PM

## 2014-06-12 NOTE — ED Notes (Signed)
RN to escort pt to CT.

## 2014-06-12 NOTE — Consult Note (Addendum)
PULMONARY  / CRITICAL CARE MEDICINE CONSULTATION  Name: Tony Wise MRN: [id number] DOB: [birth year]  PHYSICIAN REQUESTING CONSULT: Andreas Blower  REASON FOR CONSULT: hemoptysis and sarcoidosis    ADMISSION DATE:  06/12/2014  CHIEF COMPLAINT:  Coughing up blood  BRIEF PATIENT DESCRIPTION:  58 year old man with a long history of sarcoidosis presents with large volume hemoptysis.   SIGNIFICANT EVENTS / STUDIES:  1. CT chest shows no PE, does show massive fibrosis and narrowing of PAs and bronchial arteries  LINES / TUBES: 1. PIV  CULTURES: 1. None  ANTIBIOTICS: 1. None  HISTORY OF PRESENT ILLNESS:   Tony Wise is a 58 year old caucasian man with a longstanding history of sarcoidosis and COPD, followed at [facility], who presents with large volume hemoptysis.  He was shopping today when he suddenly developed cough and nausea, and felt like he had to throw up.  He began coughing and ?vomiting up blood, which he estimates to be a liter.  He describes it as bright red blood.  This has never happened to him before.  EMS brought him to the hospital, where he has had no further events.  At the time, he was breathless, wheezing, and coughing.  He felt like his chest was tight and hurting.  He denies abdominal pain, lower extremity/abdominal swelling, diarrhea, bloody stools, nosebleeds.  He has not had any fainting spells but has had some dizziness.  For the past few months, he has noticed progressive dyspnea, which now limits his ability to even walk around the house.  He has a dry cough and hears wheezing coming from his throat and his lungs.  At night he feels like "mucus gets stuck in my throat."  He also has significant reflux.  His last visit with [facility] was in March.  At that time he was started on plaquenil.  His PFTs in March included an FVC of 2.44, 52% of predicted, an FEV1 of 1.27, 34% of predicted, and a DLCO of 44% predicted. At that time, he did not need O2 on his walk. He says  that he has considered and decided against pursuing lung transplant.  He has a headaches that come and go, last about 3 minutes, and chronic back pain.  He has no rashes and no joint involvement.  His last steroid burst was several months ago and was a very brief one. He has not had fevers or copious sputum production.  In the ED, he was noted to have an elevated troponin and abnormal EKG, along with chest pain that was partially relieved by nitro.  Cardiology was consulted - they felt this was stress induced troponemia and not ACS.   PAST MEDICAL HISTORY :  Past Medical History  Diagnosis Date  . COPD (chronic obstructive pulmonary disease)   . Hyperlipidemia   . Sarcoidosis   . Gout   . Gout   . Nephrolithiasis    Past Surgical History  Procedure Laterality Date  . Nissen fundoplication      MEDICATIONS: Prior to Admission medications   Medication Sig Start Date End Date Taking? Authorizing Provider  albuterol (PROVENTIL) (2.5 MG/3ML) 0.083% nebulizer solution Take 2.5 mg by nebulization every 6 (six) hours as needed for wheezing or shortness of breath.   Yes Historical Provider, MD  albuterol-ipratropium (COMBIVENT) 18-103 MCG/ACT inhaler Inhale 1 puff into the lungs every 6 (six) hours as needed for wheezing or shortness of breath.   Yes Historical Provider, MD  fluticasone (FLONASE) 50 MCG/ACT  nasal spray Place 1 spray into both nostrils daily.   Yes Historical Provider, MD  lisinopril (PRINIVIL,ZESTRIL) 10 MG tablet Take 5 mg by mouth daily.   Yes Historical Provider, MD  Multiple Vitamins-Minerals (MULTIVITAMIN PO) Take 1 tablet by mouth daily.   Yes Historical Provider, MD    ALLERGIES: No Known Allergies  FAMILY HISTORY:  Family History  Problem Relation Age of Onset  . Diabetes Mother   . Aneurysm Father   . Other Father     Black lung    SOCIAL HISTORY: Lives with his wife Okey Regal.  Previously worked in [employer].  Smoked for 35 years, up to 2ppd, quit in 2011.   Denies illicit drugs and only drinks ETOH occasionally.   REVIEW OF SYSTEMS:  12 point review of systems was performed, with pertinent positives and negatives as above in the HPI.   PHYSICAL EXAM VITAL SIGNS: Temp:  [97.8 F (36.6 C)] 97.8 F (36.6 C) (09/19 1654) Pulse Rate:  [69-117] 75 (09/19 2300) Resp:  [12-29] 17 (09/19 2300) BP: (96-135)/(64-94) 135/94 mmHg (09/19 2300) SpO2:  [95 %-100 %] 97 % (09/19 2300)  HEMODYNAMICS:    VENTILATOR SETTINGS:   : General:  No acute distress; appears stated age Neuro:  No focal deficits; moves all extremities; follows commands; alert and oriented x4 HEENT:  Mucus membranes dry; eyes anicteric; no evidence of dry blood in nares or oral cavity; no thrush Neck:  Supple, nontender. No lymphadenopathy.  Trachea midline. No JVD. Cardiovascular:  RRR, no MRG. 2+ peripheral pulses Lungs:  Diffuse expiratory wheezing. Prolonged expiratory phase. Accentuated upper airway sounds over trachea but no overt stridor Abdomen:  Soft, nontender, nondistended.  Non-palpable liver and spleen.  Musculoskeletal:  No joint abnormalities; no clubbing. Normal muscle tone/bulk Skin:  Some ecchymoses on upper extremities; otherwise no rashes or lesions or skin breakdown noted  INS/OUTS: Intake/Output     09/19 0701 - 09/20 0700   Urine 250   Total Output 250   Net -250         CLINICAL DECISION MAKING  CBC Recent Labs     06/12/14  1530  WBC  7.5  HGB  14.0  HCT  41.9  PLT  249    Coag's No results found for this basename: APTT, INR,  in the last 72 hours  BMET Recent Labs     06/12/14  1530  NA  141  K  3.9  CL  100  CO2  27  BUN  13  CREATININE  1.41*  GLUCOSE  169*    Electrolytes Recent Labs     06/12/14  1530  CALCIUM  9.9    Sepsis Markers No results found for this basename: LACTICACIDVEN, PROCALCITON, O2SATVEN,  in the last 72 hours  ABG No results found for this basename: PHART, PCO2ART, PO2ART,  in the last 72  hours  Liver Enzymes No results found for this basename: AST, ALT, ALKPHOS, BILITOT, ALBUMIN,  in the last 72 hours  Cardiac Enzymes Recent Labs     06/12/14  1530  06/12/14  1917  TROPONINI  0.83*  2.11*    Glucose No results found for this basename: GLUCAP,  in the last 72 hours  Imaging Ct Chest Wo Contrast  06/12/2014   CLINICAL DATA:  Chest pain. Shortness of breath. Hemoptysis. Abnormal chest x-ray earlier today.  EXAM: CT CHEST WITHOUT CONTRAST  TECHNIQUE: Multidetector CT imaging of the chest was performed following the standard protocol without IV contrast.  COMPARISON:  No prior CT.  Portable chest x-ray earlier same date.  FINDINGS: Innumerable nodules throughout both lungs. Bronchiectasis and scarring associated with parenchymal calcification involving both upper lobes, accounting for the opacity on the chest x-ray. Emphysematous changes in both lungs. Small left pleural effusion. Enlarged calcified lymph nodes throughout the mediastinum and in both hila, the largest conglomerate nodes measuring approximately 3.2 x 6.1 cm in the subcarinal region (station 7).  Normal heart size. Mild LAD and left circumflex coronary atherosclerosis. Mild atherosclerosis involving the thoracic and upper abdominal aorta without aneurysm.  Bone window images demonstrate spondylosis at T8-9 and perhaps mild osseous demineralization. Visualized upper abdomen unremarkable for the unenhanced technique.  IMPRESSION: 1. 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 This is typically seen in patients with dust inhalational pneumoconioses such as coal workers pneumoconiosis, silicosis and occasionally talcosis. 2. Mediastinal and bilateral hilar lymphadenopathy with calcifications as part of the same pathologic process.   Electronically  Signed   By: Hulan Saas M.D.   On: 06/12/2014 17:01   Ct Angio Chest W/cm &/or Wo Cm  06/12/2014   CLINICAL DATA:  Sudden onset of nausea. Vomiting bright red blood. Chest pain. History of sarcoidosis and COPD.  EXAM: CT ANGIOGRAPHY CHEST WITH CONTRAST  TECHNIQUE: Multidetector CT imaging of the chest was performed using the standard protocol during bolus administration of intravenous contrast. Multiplanar CT image reconstructions and MIPs were obtained to evaluate the vascular anatomy.  CONTRAST:  OMNIPAQUE IOHEXOL 350 MG/ML SOLN  COMPARISON:  06/12/2014 at 1642 hr.  FINDINGS: No evidence of a pulmonary embolus.  Heart is normal in size.  Minor coronary artery calcifications.  There is bulky partly calcified mediastinal adenopathy as well as bulky hilar adenopathy also partly calcified. Hilar adenopathy is contiguous with progressive massive fibrosis and associated calcifications in both upper lobes.  There are numerous small pulmonary nodules many with calcifications.  Bulky fibrosis narrows the upper lobe pulmonary arteries and bronchi. Significant fibrosis and volume loss is noted in the right middle lobe, also with areas of bronchial narrowing.  No pleural effusion.  No pneumothorax.  Limited evaluation of the upper abdomen is unremarkable.  No osteoblastic or osteolytic lesions.  Review of the MIP images confirms the above findings.  IMPRESSION: 1. No evidence of a pulmonary embolism. 2. As seen on the study obtained earlier the same date, there is progressive massive fibrosis, numerous pulmonary nodules, bulky mediastinal and hilar adenopathy, with scattered calcifications noted throughout the nodes in pulmonary nodules and upper lobe fibrosis. Although the history provided indicates sarcoidosis, other etiologies as mentioned in the early report including silicosis should be considered. 3. There are areas of bronchial and pulmonary artery narrowing mostly in the upper lobes. The possibility of  the fibrosis eroding into a bronchus in leading to bleeding should be considered given the history.   Electronically Signed   By: Amie Portland M.D.   On: 06/12/2014 19:54   Dg Chest Portable 1 View  06/12/2014   CLINICAL DATA:  Right-sided chest pain and shortness of breath. Nausea. History of COPD and sarcoid.  EXAM: PORTABLE CHEST - 1 VIEW  COMPARISON:  None.  FINDINGS: Cardiac and mediastinal contours are largely obscured due to overlying opacities. Upper retraction of the hila bilaterally. There is large consolidative opacity within the right greater than left upper lungs. Diffuse bilateral scattered  nodules. No pleural effusion. No definite pneumothorax. Low lung volumes. Regional skeleton is unremarkable. Relative lucency under left hemidiaphragm.  IMPRESSION: Right-greater-than-left upper lung large consolidative pulmonary opacities as well as diffuse bilateral pulmonary nodules. In the absence of priors for comparison, findings may be secondary to sarcoidosis and associated progressive massive fibrosis. Underlying pulmonary malignancy or superimposed infectious process are additional considerations. Correlation with outside priors would prove helpful.  Small right pleural effusion versus thickening.  Relative lucency under left hemidiaphragm favored to represent gas within the colon. If there is concern for intra-abdominal pathology, can correlate with decubitus views to exclude free air.  These results were called by telephone at the time of interpretation on 06/12/2014 at 4:20 pm to [facility] , who verbally acknowledged these results.   Electronically Signed   By: Annia Belt M.D.   On: 06/12/2014 16:24    EKG: sinus tachy, right bundle branch  CXR: diffuse areas of opacification and nodules, consistent with known sarcoid CT Chest: No evidence of a pulmonary embolus. Heart is normal in size. Minor coronary artery calcifications. There is bulky partly calcified mediastinal adenopathy as well as  bulky hilar adenopathy also partly calcified. Hilar adenopathy is contiguous with progressive massive fibrosis and associated calcifications in both upper lobes. There are numerous small pulmonary nodules many with calcifications. Bulky fibrosis narrows the upper lobe pulmonary arteries and bronchi. Significant fibrosis and volume loss is noted in the right middle lobe, also with areas of bronchial narrowing. No pleural effusion. No pneumothorax.  ASSESSMENT / PLAN: Active Problems:   Nausea and vomiting   Chest pain   Acute respiratory failure   Hemoptysis   Elevated troponin   GERD (gastroesophageal reflux disease)   Hypertension   Shortness of breath   NSTEMI (non-ST elevated myocardial infarction)  58 y/o man with history as above, presenting with likely hemoptysis, though possible upper GI source of bleeding cannot be ruled out at this time.  His sarcoid is advanced and CT shows evidence of bulky mediastinal component that is possibly eroding into his PAs.   NEUROLOGIC A:  Headaches P: PRN tylenol  Sounds like cluster headaches per description - can treat with supplemental oxygen   PULMONARY A:  Sarcoidosis with granulomatous mediastinitis Hemoptysis likely due to infiltration of PAs by sarcoid COPD P: Start steroids (IV for now, given will keep NPO overnight) for sarcoidosis.  Continue home plaquenil  Scheduled duonebs No antibiotics for now, given no signs or symptoms of pneumonia or bronchitis  Supplemental oxygen to keep sats >94% Monitor in ICU due to risk of re-bleed Bronchoscopy likely in the AM   CARDIOVASCULAR A:  Demand ischemia with elevated troponin P: Cycling enzymes per cardiology  No anticoagulation, no cath Echo done, results pending Nitroglycerin PRN chest pain OK, as pt perceived benefit, though I suspect chest pain was more likely due to his respiratory distress Continue home lisinopril  RENAL A: Possible CKD? - records from [facility] indicate his  lowest creatinine has been 1.2, but has ranged from 1.4 - 2.4  P: Gentle fluid  GASTROINTESTINAL A:  GERD, s/p Nissen Nausea/vomiting Possible upper GI bleed, but hemoptysis more likely P: Continue BID PPI for now NPO Anti-emetic PRN LFT check (last LFTs [facility] at [facility]) Stool guiac   HEMATOLOGIC A:  No acute issues P: Monitor CBC   INFECTIOUS DISEASE - No acute issues  ENDOCRINE - No acute issues  BEST PRACTICE / DISPOSITION Level of Care:  ICU Consultants:  CARDIOLOGY Code Status:  FULL (but patient would not want to be on machine for prolonged period) Diet:  NPO DVT Px:  SCDs (pharmacological is contraindicated due to bleeding) GI Px:  PPI Skin Integrity:  Intact - monitor Social / Family:  Wife Okey Regal [phone]   I have personally obtained a history, examined the patient, evaluated laboratory and imaging results, formulated the assessment and plan and placed orders.  CCM will assume care of this patient   Pershing Cox, MD Pulmonary and Critical Care Medicine [facility] Pager: [phone]  06/12/2014, 11:52 PM

## 2014-06-12 NOTE — ED Notes (Addendum)
Pt via EMS, Sudden onset nausea. Vomited bright red blood, onset sob and chest pain. Pt reports ride sided chest pain. EKG shows high ST-II.

## 2014-06-12 NOTE — ED Notes (Signed)
CODE STEMI cancelled.  

## 2014-06-12 NOTE — Progress Notes (Signed)
Echocardiogram 2D Echocardiogram has been performed.  Jontay, Maston 06/12/2014, 11:12 PM

## 2014-06-12 NOTE — Progress Notes (Signed)
ANTIBIOTIC CONSULT NOTE - INITIAL  Pharmacy Consult for levaquin Indication: COPD  No Known Allergies  Patient Measurements:   Adjusted Body Weight:   Vital Signs: Temp: 97.8 F (36.6 C) (09/19 1654) Temp src: Oral (09/19 1654) BP: 104/71 mmHg (09/19 2130) Pulse Rate: 69 (09/19 2113) Intake/Output from previous day:   Intake/Output from this shift: Total I/O In: -  Out: 250 [Urine:250]  Labs:  Recent Labs  06/12/14 1530  WBC 7.5  HGB 14.0  PLT 249  CREATININE 1.41*   CrCl is unknown because there is no height on file for the current visit. No results found for this basename: VANCOTROUGH, VANCOPEAK, VANCORANDOM, GENTTROUGH, GENTPEAK, GENTRANDOM, TOBRATROUGH, TOBRAPEAK, TOBRARND, AMIKACINPEAK, AMIKACINTROU, AMIKACIN,  in the last 72 hours   Microbiology: No results found for this or any previous visit (from the past 720 hour(s)).  Medical History: Past Medical History  Diagnosis Date  . COPD (chronic obstructive pulmonary disease)   . Hyperlipidemia   . Sarcoidosis   . Gout     Medications:  Scheduled:  . aspirin      . methylPREDNISolone (SOLU-MEDROL) injection  60 mg Intravenous Q8H   Infusions:  . nitroGLYCERIN 11.667 mcg/min (06/12/14 1721)   Assessment: 58 yo who was admitted for CP and SOB. He has a hx COPD with sarcoidosis. CT do not show PE. Levaquin has been started for COPD exacerbation.   Goal of Therapy:  Appropriate dose  Plan:   Levaquin  PO qday F/u with scr in AM and adjust dose if needed

## 2014-06-13 ENCOUNTER — Encounter (HOSPITAL_COMMUNITY): Payer: Self-pay | Admitting: Internal Medicine

## 2014-06-13 DIAGNOSIS — I214 Non-ST elevation (NSTEMI) myocardial infarction: Secondary | ICD-10-CM

## 2014-06-13 DIAGNOSIS — D869 Sarcoidosis, unspecified: Secondary | ICD-10-CM

## 2014-06-13 LAB — LIPID PANEL
Cholesterol: 200 mg/dL (ref 0–200)
HDL: 37 mg/dL — AB (ref >39)
LDL CALC: 138 mg/dL — AB (ref 0–99)
TRIGLYCERIDES: 123 mg/dL (ref <150)
Total CHOL/HDL Ratio: 5.4 RATIO
VLDL: 25 mg/dL (ref 0–40)

## 2014-06-13 LAB — BASIC METABOLIC PANEL
Anion gap: 13 (ref 5–15)
BUN: 13 mg/dL (ref 6–23)
CHLORIDE: 101 meq/L (ref 96–112)
CO2: 25 meq/L (ref 19–32)
CREATININE: 1.34 mg/dL (ref 0.50–1.35)
Calcium: 9.6 mg/dL (ref 8.4–10.5)
GFR calc Af Amer: 67 mL/min — ABNORMAL LOW (ref >90)
GFR calc non Af Amer: 58 mL/min — ABNORMAL LOW (ref >90)
Glucose, Bld: 121 mg/dL — ABNORMAL HIGH (ref 70–99)
Potassium: 4.3 mEq/L (ref 3.7–5.3)
Sodium: 139 mEq/L (ref 137–147)

## 2014-06-13 LAB — HEPATIC FUNCTION PANEL
ALBUMIN: 3.7 g/dL (ref 3.5–5.2)
ALK PHOS: 102 U/L (ref 39–117)
ALT: 12 U/L (ref 0–53)
AST: 23 U/L (ref 0–37)
BILIRUBIN TOTAL: 0.9 mg/dL (ref 0.3–1.2)
Total Protein: 7.6 g/dL (ref 6.0–8.3)

## 2014-06-13 LAB — TROPONIN I
TROPONIN I: 0.72 ng/mL — AB (ref <0.30)
Troponin I: 1.13 ng/mL (ref <0.30)

## 2014-06-13 LAB — CBC
HEMATOCRIT: 40.4 % (ref 39.0–52.0)
HEMOGLOBIN: 13.6 g/dL (ref 13.0–17.0)
MCH: 26.7 pg (ref 26.0–34.0)
MCHC: 33.7 g/dL (ref 30.0–36.0)
MCV: 79.2 fL (ref 78.0–100.0)
Platelets: 241 10*3/uL (ref 150–400)
RBC: 5.1 MIL/uL (ref 4.22–5.81)
RDW: 13.7 % (ref 11.5–15.5)
WBC: 6.9 10*3/uL (ref 4.0–10.5)

## 2014-06-13 LAB — CK
Total CK: 97 U/L (ref 7–232)
Total CK: 89 U/L (ref 7–232)

## 2014-06-13 LAB — GLUCOSE, CAPILLARY: Glucose-Capillary: 119 mg/dL — ABNORMAL HIGH (ref 70–99)

## 2014-06-13 LAB — HEMOGLOBIN A1C
Hgb A1c MFr Bld: 6 % — ABNORMAL HIGH (ref <5.7)
Mean Plasma Glucose: 126 mg/dL — ABNORMAL HIGH (ref <117)

## 2014-06-13 LAB — MRSA PCR SCREENING: MRSA by PCR: NEGATIVE

## 2014-06-13 MED ORDER — ACETAMINOPHEN 325 MG PO TABS: 650.0000 mg | ORAL_TABLET | Freq: Four times a day (QID) | ORAL | Status: DC | PRN

## 2014-06-13 MED ORDER — ALBUTEROL SULFATE (2.5 MG/3ML) 0.083% IN NEBU
2.5000 mg | INHALATION_SOLUTION | Freq: Four times a day (QID) | RESPIRATORY_TRACT | Status: DC
  Administered 2014-06-13 (×3): 2.5 mg via RESPIRATORY_TRACT
  Filled 2014-06-13 (×3): qty 3

## 2014-06-13 MED ORDER — IPRATROPIUM-ALBUTEROL 0.5-2.5 (3) MG/3ML IN SOLN
3.0000 mL | Freq: Four times a day (QID) | RESPIRATORY_TRACT | Status: DC
  Administered 2014-06-13 – 2014-06-14 (×4): 3 mL via RESPIRATORY_TRACT
  Filled 2014-06-13 (×4): qty 3

## 2014-06-13 MED ORDER — INFLUENZA VAC SPLIT QUAD 0.5 ML IM SUSY
0.5000 mL | PREFILLED_SYRINGE | INTRAMUSCULAR | Status: AC
  Administered 2014-06-14: 0.5 mL via INTRAMUSCULAR
  Filled 2014-06-13: qty 0.5

## 2014-06-13 MED ORDER — ALBUTEROL SULFATE (2.5 MG/3ML) 0.083% IN NEBU: 2.5000 mg | INHALATION_SOLUTION | RESPIRATORY_TRACT | Status: DC | PRN

## 2014-06-13 MED ORDER — HYDROXYCHLOROQUINE SULFATE 200 MG PO TABS
200.0000 mg | ORAL_TABLET | Freq: Two times a day (BID) | ORAL | Status: DC
  Filled 2014-06-13 (×5): qty 1

## 2014-06-13 MED ORDER — ONDANSETRON HCL 4 MG/2ML IJ SOLN: 4.0000 mg | Freq: Four times a day (QID) | INTRAMUSCULAR | Status: DC | PRN

## 2014-06-13 MED ORDER — SODIUM CHLORIDE 0.9 % IJ SOLN
3.0000 mL | Freq: Two times a day (BID) | INTRAMUSCULAR | Status: DC
  Administered 2014-06-13 (×2): 3 mL via INTRAVENOUS

## 2014-06-13 MED ORDER — CETYLPYRIDINIUM CHLORIDE 0.05 % MT LIQD
7.0000 mL | Freq: Two times a day (BID) | OROMUCOSAL | Status: DC
Start: 2014-06-13 — End: 2014-06-14
  Administered 2014-06-13 – 2014-06-14 (×3): 7 mL via OROMUCOSAL

## 2014-06-13 MED ORDER — FLUTICASONE PROPIONATE 50 MCG/ACT NA SUSP
1.0000 | Freq: Every day | NASAL | Status: DC
  Administered 2014-06-13 – 2014-06-14 (×2): 1 via NASAL
  Filled 2014-06-13: qty 16

## 2014-06-13 MED ORDER — ACETAMINOPHEN 325 MG PO TABS
650.0000 mg | ORAL_TABLET | Freq: Four times a day (QID) | ORAL | Status: DC | PRN
  Administered 2014-06-13 (×2): 650 mg via ORAL
  Filled 2014-06-13 (×2): qty 2

## 2014-06-13 MED ORDER — IPRATROPIUM BROMIDE 0.02 % IN SOLN
0.5000 mg | Freq: Four times a day (QID) | RESPIRATORY_TRACT | Status: DC
Start: 2014-06-13 — End: 2014-06-13
  Administered 2014-06-13 (×3): 0.5 mg via RESPIRATORY_TRACT
  Filled 2014-06-13 (×3): qty 2.5

## 2014-06-13 MED ORDER — SODIUM CHLORIDE 0.9 % IV SOLN
INTRAVENOUS | Status: AC
  Administered 2014-06-13: 01:00:00 via INTRAVENOUS

## 2014-06-13 MED ORDER — ONDANSETRON HCL 4 MG PO TABS: 4.0000 mg | ORAL_TABLET | Freq: Four times a day (QID) | ORAL | Status: DC | PRN

## 2014-06-13 MED ORDER — LISINOPRIL 5 MG PO TABS
5.0000 mg | ORAL_TABLET | Freq: Every day | ORAL | Status: DC
  Filled 2014-06-13: qty 1

## 2014-06-13 MED ORDER — METHYLPREDNISOLONE SODIUM SUCC 125 MG IJ SOLR
60.0000 mg | INTRAMUSCULAR | Status: DC
  Administered 2014-06-13: 60 mg via INTRAVENOUS
  Filled 2014-06-13 (×2): qty 0.96

## 2014-06-13 MED ORDER — ACETAMINOPHEN 650 MG RE SUPP: 650.0000 mg | Freq: Four times a day (QID) | RECTAL | Status: DC | PRN

## 2014-06-13 NOTE — Progress Notes (Signed)
PULMONARY  / CRITICAL CARE MEDICINE CONSULTATION  Name: Tony Wise MRN: [id number] DOB: [birth year]  PHYSICIAN REQUESTING CONSULT: Andreas Blower  REASON FOR CONSULT: hemoptysis and sarcoidosis    ADMISSION DATE:  06/12/2014  CHIEF COMPLAINT:  Coughing up blood  BRIEF PATIENT DESCRIPTION:  58 year old man with a long history of sarcoidosis presents with large volume hemoptysis.  His last visit with [facility] was in March.  At that time he was started on plaquenil.  His PFTs in March included an FVC of 2.44, 52% of predicted, an FEV1 of 1.27, 34% of predicted, and a DLCO of 44% predicted. At that time, he did not need O2 on his walk. He says that he has considered and decided against pursuing lung transplant.  He has a headaches that come and go, last about 3 minutes, and chronic back pain.  He has no rashes and no joint involvement.  His last steroid burst was several months ago and was a very brief one. He has not had fevers or copious sputum production.  In the ED, he was noted to have an elevated troponin and abnormal EKG, along with chest pain that was partially relieved by nitro.  Cardiology was consulted - they felt this was stress induced troponemia and not ACS.   SIGNIFICANT EVENTS / STUDIES:  CT chest shows no PE, does show massive fibrosis and narrowing of PAs and bronchial arteries Echo 9/19 - nml LV fn  LINES / TUBES: 1. PIV  CULTURES: 1. None  ANTIBIOTICS: 1. None  VITAL SIGNS: Temp:  [97.4 F (36.3 C)-98.6 F (37 C)] 98.6 F (37 C) (09/20 0734) Pulse Rate:  [65-117] 73 (09/20 0700) Resp:  [12-29] 12 (09/20 0700) BP: (87-135)/(55-94) 105/71 mmHg (09/20 0700) SpO2:  [92 %-100 %] 97 % (09/20 0700) Weight:  [82.9 kg (182 lb 12.2 oz)] 82.9 kg (182 lb 12.2 oz) (09/20 0100)  HEMODYNAMICS:    VENTILATOR SETTINGS:    PHYSICAL EXAM General:  No acute distress; appears stated age Neuro:  No focal deficits; moves all extremities; follows commands; alert and  oriented x4 HEENT:  Mucus membranes dry; eyes anicteric; no evidence of dry blood in nares or oral cavity; no thrush Neck:  Supple, nontender. No lymphadenopathy.  Trachea midline. No JVD. Cardiovascular:  RRR, no MRG. 2+ peripheral pulses Lungs:  Diffuse expiratory wheezing. Prolonged expiratory phase. Accentuated upper airway sounds over trachea but no overt stridor Abdomen:  Soft, nontender, nondistended.  Non-palpable liver and spleen.  Musculoskeletal:  No joint abnormalities; no clubbing. Normal muscle tone/bulk Skin:  Some ecchymoses on upper extremities; otherwise no rashes or lesions or skin breakdown noted  INS/OUTS: Intake/Output     09/19 0701 - 09/20 0700 09/20 0701 - 09/21 0700   I.V. (mL/kg) 500 (6)    Total Intake(mL/kg) 500 (6)    Urine (mL/kg/hr) 1050    Total Output 1050     Net -550            CLINICAL DECISION MAKING  CBC Recent Labs     06/12/14  1530  06/13/14  0142  WBC  7.5  6.9  HGB  14.0  13.6  HCT  41.9  40.4  PLT  249  241    Coag's No results found for this basename: APTT, INR,  in the last 72 hours  BMET Recent Labs     06/12/14  1530  06/13/14  0142  NA  141  139  K  3.9  4.3  CL  100  101  CO2  27  25  BUN  13  13  CREATININE  1.41*  1.34  GLUCOSE  169*  121*    Electrolytes Recent Labs     06/12/14  1530  06/13/14  0142  CALCIUM  9.9  9.6    Sepsis Markers No results found for this basename: LACTICACIDVEN, PROCALCITON, O2SATVEN,  in the last 72 hours  ABG No results found for this basename: PHART, PCO2ART, PO2ART,  in the last 72 hours  Liver Enzymes Recent Labs     06/13/14  0142  AST  23  ALT  12  ALKPHOS  102  BILITOT  0.9  ALBUMIN  3.7    Cardiac Enzymes Recent Labs     06/12/14  1917  06/13/14  0142  06/13/14  0645  TROPONINI  2.11*  1.13*  0.72*    Glucose Recent Labs     06/13/14  0147  GLUCAP  119*    Imaging   EKG: sinus tachy, right bundle branch  CXR: diffuse areas of  opacification and nodules, consistent with known sarcoid CT Chest: No evidence of a pulmonary embolus. Heart is normal in size. Minor coronary artery calcifications. There is bulky partly calcified mediastinal adenopathy as well as bulky hilar adenopathy also partly calcified. Hilar adenopathy is contiguous with progressive massive fibrosis and associated calcifications in both upper lobes. There are numerous small pulmonary nodules many with calcifications. Bulky fibrosis narrows the upper lobe pulmonary arteries and bronchi. Significant fibrosis and volume loss is noted in the right middle lobe, also with areas of bronchial narrowing. No pleural effusion. No pneumothorax.  ASSESSMENT / PLAN: Active Problems:   Nausea and vomiting   Chest pain   Acute respiratory failure   Hemoptysis   Elevated troponin   GERD (gastroesophageal reflux disease)   Hypertension   Shortness of breath   NSTEMI (non-ST elevated myocardial infarction)  58 y/o man with history as above, presenting with likely hemoptysis, though possible upper GI source of bleeding cannot be ruled out at this time.  His sarcoid is advanced and CT shows evidence of bulky mediastinal component   NEUROLOGIC A:  Headaches P: PRN tylenol  Sounds like cluster headaches per description - can treat with supplemental oxygen   PULMONARY A:  Sarcoidosis with granulomatous mediastinitis Hemoptysis likely due to infiltration of PAs by sarcoid COPD -ex heavy smoker P:  solumedrol for sarcoidosis.  Continue home plaquenil  Scheduled duonebs levaquin empiric Supplemental oxygen to keep sats >94% Bronchoscopy if rebleed  CARDIOVASCULAR A:  Demand ischemia with elevated troponin -trending down P: No anticoagulation, no cath Nitroglycerin PRN chest pain OK, as pt perceived benefit, though I suspect chest pain was more likely due to his respiratory distress hold home lisinopril  RENAL A: Possible CKD? - records from [facility] indicate  his lowest creatinine has been 1.2, but has ranged from 1.4 - 2.4  P: Gentle fluid  GASTROINTESTINAL A:  GERD, s/p Nissen Nausea/vomiting Possible upper GI bleed, but hemoptysis more likely P: Continue BID PPI for now Advance full liquids Anti-emetic PRN Stool guiac   HEMATOLOGIC A:  No acute issues P: Monitor CBC    Social / Family:  Wife Okey Regal [phone] Summary - Hemoptysis from sarcoid, but started with hematemesis, observation for now, will need bscopy to lateralise if rebleed OK to transfer to tele  I have personally obtained a history, examined the patient, evaluated laboratory and imaging results, formulated the assessment and plan  and placed orders.  Cyril Mourning MD. Tonny Bollman. [facility] Pager [phone] If no response call [phone]      06/13/2014, 8:04 AM

## 2014-06-13 NOTE — Progress Notes (Signed)
CPK is only 71 and minimal bump in MB. Troponin elevation out of proportion to CPK.  This is not consistent with acute coronary syndrome and is most likely secondary to demand ischemia from underlying pulmonary issues and acute vomiting.  2D echo overall LVF appears normal but cannot accurately assess for RWMA's due to poor visualization of endocardium in all views.  Recommend repeat limited echo with definity contrast in am.

## 2014-06-13 NOTE — Progress Notes (Signed)
Attempted to call report to 3W at [phone]. RN said she would have receiving nurse call me back shortly to obtain report.

## 2014-06-14 DIAGNOSIS — K219 Gastro-esophageal reflux disease without esophagitis: Secondary | ICD-10-CM

## 2014-06-14 DIAGNOSIS — R112 Nausea with vomiting, unspecified: Secondary | ICD-10-CM

## 2014-06-14 DIAGNOSIS — I214 Non-ST elevation (NSTEMI) myocardial infarction: Secondary | ICD-10-CM | POA: Diagnosis not present

## 2014-06-14 DIAGNOSIS — I248 Other forms of acute ischemic heart disease: Secondary | ICD-10-CM

## 2014-06-14 LAB — BASIC METABOLIC PANEL
Anion gap: 17 — ABNORMAL HIGH (ref 5–15)
BUN: 18 mg/dL (ref 6–23)
CHLORIDE: 97 meq/L (ref 96–112)
CO2: 24 meq/L (ref 19–32)
CREATININE: 1.28 mg/dL (ref 0.50–1.35)
Calcium: 10.2 mg/dL (ref 8.4–10.5)
GFR calc Af Amer: 71 mL/min — ABNORMAL LOW (ref >90)
GFR calc non Af Amer: 61 mL/min — ABNORMAL LOW (ref >90)
Glucose, Bld: 144 mg/dL — ABNORMAL HIGH (ref 70–99)
Potassium: 4.2 mEq/L (ref 3.7–5.3)
Sodium: 138 mEq/L (ref 137–147)

## 2014-06-14 LAB — CBC
HEMATOCRIT: 43.1 % (ref 39.0–52.0)
HEMOGLOBIN: 14.2 g/dL (ref 13.0–17.0)
MCH: 26.4 pg (ref 26.0–34.0)
MCHC: 32.9 g/dL (ref 30.0–36.0)
MCV: 80.3 fL (ref 78.0–100.0)
Platelets: 300 10*3/uL (ref 150–400)
RBC: 5.37 MIL/uL (ref 4.22–5.81)
RDW: 14.1 % (ref 11.5–15.5)
WBC: 17 10*3/uL — ABNORMAL HIGH (ref 4.0–10.5)

## 2014-06-14 LAB — TROPONIN I: Troponin I: 0.43 ng/mL (ref <0.30)

## 2014-06-14 MED ORDER — PANTOPRAZOLE SODIUM 40 MG PO TBEC: 40.0000 mg | DELAYED_RELEASE_TABLET | Freq: Two times a day (BID) | ORAL | Status: AC

## 2014-06-14 MED ORDER — PANTOPRAZOLE SODIUM 40 MG PO TBEC: 40.0000 mg | DELAYED_RELEASE_TABLET | Freq: Two times a day (BID) | ORAL | Status: DC

## 2014-06-14 MED ORDER — PERFLUTREN LIPID MICROSPHERE
1.0000 mL | INTRAVENOUS | Status: AC | PRN
  Administered 2014-06-14: 2 mL via INTRAVENOUS
  Filled 2014-06-14: qty 10

## 2014-06-14 MED ORDER — IPRATROPIUM-ALBUTEROL 0.5-2.5 (3) MG/3ML IN SOLN: 3.0000 mL | RESPIRATORY_TRACT | Status: DC | PRN

## 2014-06-14 NOTE — Progress Notes (Addendum)
Spoke with Dr. Craige Cotta and notified him of patient's increase in [facility] prior to discharge, stated patient would followup with pulmonology soon.  Spoke with Wynema Birch PA with cardiology, repeat 2decho read and fine for discharge.  Patient's wife picked up CT scan disk from radiology.  Reviewed discharge instructions with patient and wife and they stated their understanding.  Discharged home with wife via wheelchair. Colman Cater

## 2014-06-14 NOTE — Plan of Care (Signed)
Problem: Phase III Progression Outcomes Goal: Discharge plan remains appropriate-arrangements made Outcome: Completed/Met Date Met:  06/14/14 Discharge to home.

## 2014-06-14 NOTE — Discharge Summary (Signed)
Physician Discharge Summary  Patient ID: [place] MRN: [id number] DOB/AGE: [birth year] 58 y.o.  Admit date: 06/12/2014 Discharge date: 06/14/2014    Discharge Diagnoses:  Questionable Hemoptysis Sarcoidosis with Granulomatous Mediastinitis COPD Questionable Hematemesis GERD Hx Nissen Fundoplication Elevated Troponin Headaches                                                                       DISCHARGE PLAN BY DIAGNOSIS     Questionable Hemoptysis Sarcoidosis with Granulomatous Mediastinitis COPD  Discharge Plan: Resume prior home medications:  Combivent inhaler PRN, fluticasone and PRN nebulized albuterol  Follow up with Dr. Madalyn Rob at [facility] ambulatory desaturations with primary pulmonary   Questionable Hematemesis GERD Hx Nissen Fundoplication  Discharge Plan: BID Protonix  Follow up with primary GI as outpatient   Elevated Troponin (Demand Ischemia) HTN   Discharge Plan: Continue lisinopril   Headaches   Discharge Plan: Ok for PRN tylenol  Follow up with PCP                   DISCHARGE SUMMARY   [place] is a 58 y.o. y/o male with a PMH of HTN, headaches, GERD, Nissen Fundoplication, COPD, Sarcoidosis with Granulomatous Mediastinitis (followed at [facility] by Dr. Madalyn Rob) who presented to [facility] on 06/12/14 with hematemesis vs hemoptysis.  The patient [place] a resident of [place], [place] and was visiting [place] when he developed sudden onset of nausea / vomiting.  He attempted to contain the emesis and tried to swallow it.  After the initial event, he developed a severe coughing episode and shortness of breath with associated chest pain.  He called 911 after coughing up bright red blood which he estimated to be approximately 1 liter.  He was transported to the ER where an EKG had questionable ST changes and elevated troponin (0.83, with peak of 2.11).  He was evaluated as a Code STEMI but felt not to be a candidate for emergent cardiac cath  due to non-convincing EKG changes (demand ischemia) & hemoptysis vs hematemesis. The patient was initially admitted to the medical service for management. The patient was treated on admission as a COPD exacerbation + possible aspiration.  CTA of Chest was evaluated and negative for PE.  Cardiac enzymes were cycled and peaked at 2.11.  He was deemed not a candidate for aspirin / heparin due to bleeding.  ECHO assessment appeared to have a normal LVF but could not accurately assess for RWMA's due to poor visualization of endocardium in all views.  At time of discharge it is felt bleeding was hematemesis rather than hemoptysis.  After initial presentation, he had no further bleeding.  Patient does endorse chronic streaking of blood in sputum at baseline but this was large volume and if related to pulmonary arteries, he would have continued to have hemoptysis.  Most likely, this was hematemesis with aspiration.  See discharge plans as above.       For the past few months prior to admission, he has noticed progressive dyspnea, which now limits his ability to even walk around the house and feels he needs oxygen. He has a dry cough and hears wheezing coming from his throat and his lungs. At night he feels like "mucus gets  stuck in my throat." He also has had significant reflux. His last visit with [facility] was in March of 2015.  At that time he was started on plaquenil. His PFTs in March included an FVC of 2.44, 52% of predicted, an FEV1 of 1.27, 34% of predicted, and a DLCO of 44% predicted.  At that time, he did not need O2 on his 78mn walk. He says that he has considered and decided against pursuing lung transplant. He has a headaches that come and go, last about 3 minutes, and chronic back pain.             STUDIES:  9/19 Echo >> EF 55 to 60%  9/19 CT chest >> PMF with calcifications and BTX, multiple nodules, mediastinal/hilar LAN with calcifications   Discharge Exam: General: WDWN male lying in  bed, NAD  Neuro: No focal deficits; MAE; follows commands; alert and oriented x4  HEENT: Eyes anicteric; no evidence of dry blood in nares or oral cavity  Neck: Supple, nontender. No lymphadenopathy. Trachea midline. No JVD.  Cardiovascular: RRR, no MRG. 2+ peripheral pulses  Lungs: Diffuse expiratory wheezing, prolonged expiratory phase. Accentuated upper airway sounds over trachea but no overt stridor  Abdomen: Soft, nontender, nondistended.  Musculoskeletal: No joint abnormalities; no clubbing. Normal muscle tone/bulk  Skin: No rashes or lesions or skin breakdown noted   Filed Vitals:   06/14/14 0500 06/14/14 0824 06/14/14 1339 06/14/14 1400  BP: 104/66   112/77  Pulse: 77 72  80  Temp: 97.8 F (36.6 C)   98 F (36.7 C)  TempSrc:    Oral  Resp: 16 18 18 20   Height:      Weight: 180 lb (81.647 kg)     SpO2: 96%   99%     Discharge Labs  BMET  Recent Labs Lab 06/12/14 1530 06/13/14 0142 06/14/14 1120  NA 141 139 138  K 3.9 4.3 4.2  CL 100 101 97  CO2 27 25 24   GLUCOSE 169* 121* 144*  BUN 13 13 18   CREATININE 1.41* 1.34 1.28  CALCIUM 9.9 9.6 10.2   CBC  Recent Labs Lab 06/12/14 1530 06/13/14 0142 06/14/14 1120  HGB 14.0 13.6 14.2  HCT 41.9 40.4 43.1  WBC 7.5 6.9 17.0*  PLT 249 241 300    Discharge Instructions   Call MD for:  difficulty breathing, headache or visual disturbances    Complete by:  As directed      Call MD for:  extreme fatigue    Complete by:  As directed      Call MD for:  hives    Complete by:  As directed      Call MD for:  persistant dizziness or light-headedness    Complete by:  As directed      Call MD for:  persistant nausea and vomiting    Complete by:  As directed      Call MD for:  severe uncontrolled pain    Complete by:  As directed      Call MD for:  temperature >100.4    Complete by:  As directed      Diet - low sodium heart healthy    Complete by:  As directed      Discharge instructions    Complete by:  As  directed   1. Review your medications carefully as they have changed 2. Stop taking Plaquenil  3. Follow up with your Primary MD regarding hospitalization and your Pulmonologist  at [facility] as scheduled.     Increase activity slowly    Complete by:  As directed             Follow-up Information   Follow up with Dr. Lubertha Basque . Schedule an appointment as soon as possible for a visit in 1 week. (Call for an appointment to be seen for hospital follow up for one week. )    Contact information:   [facility]  [place], [place]      Follow up with Harlene Salts On 06/21/2014. (Follow up as previously arranged with Dr. Madalyn Rob. )    Specialty:  Allergy and Immunology   Contact information:   [facility] [address] [place] [address] [phone]          Medication List         albuterol (2.5 MG/3ML) 0.083% nebulizer solution  Commonly known as:  PROVENTIL  Take 2.5 mg by nebulization every 6 (six) hours as needed for wheezing or shortness of breath.     albuterol-ipratropium 18-103 MCG/ACT inhaler  Commonly known as:  COMBIVENT  Inhale 1 puff into the lungs every 6 (six) hours as needed for wheezing or shortness of breath.     fluticasone 50 MCG/ACT nasal spray  Commonly known as:  FLONASE  Place 1 spray into both nostrils daily.     lisinopril 10 MG tablet  Commonly known as:  PRINIVIL,ZESTRIL  Take 5 mg by mouth daily.     MULTIVITAMIN PO  Take 1 tablet by mouth daily.     pantoprazole 40 MG tablet  Commonly known as:  PROTONIX  Take 1 tablet (40 mg total) by mouth 2 (two) times daily with a meal.         Disposition:  Home, no new home health needs identified.    Discharged Condition: [place] has met maximum benefit of inpatient care and is medically stable and cleared for discharge.  Patient is pending follow up as above.      Time spent on disposition:  Greater than 35 minutes.   Signed: Noe Gens, NP-C [facility] Pgr: [phone] Office: Lee Mont, MD [facility] 06/14/2014, 4:02 PM Pager:  [phone] After 3pm call: [phone]

## 2014-06-14 NOTE — Progress Notes (Signed)
  Echocardiogram 2D Echocardiogram has been performed.  [place] 06/14/2014, 11:35 AM

## 2014-06-14 NOTE — Discharge Instructions (Signed)
Aspiration Precautions Aspiration is the inhaling of a liquid or object into the lungs. Things that can be inhaled into the lungs include:  Food.  Any type of liquid, such as drinks or saliva.  Stomach contents, such as vomit or stomach acid. When these things go into the lungs, damage can occur. Serious complications can then result, such as:  A lung infection (pneumonia).  A collection of pus in the lungs (lung abscess). CAUSES  A decreased level of awareness (consciousness) due to:  Traumatic brain injury or head injury.  Stroke.  Neurological disease.  Seizures.  Decreased or absent gag reflex (inability to cough).  Medical conditions that affect swallowing.  Conditions that affect the food pipe (esophagus) such as a narrowing of the esophagus (esophageal stricture).  Gastroesophageal reflux (GERD). This is also known as acid reflux.  Any type of surgery where you are put under general anesthesia or have sedation.  Drinking large amounts of alcohol.  Taking medication that causes drowsiness, confusion, or weakness.  Aging.  Dental problems.  Having a feeding tube. SYMPTOMS When aspiration occurs, different signs and symptoms can occur, such as:  Coughing (if a person has a cough or gag reflex) after swallowing food or liquids.  Difficulty breathing. This can include things like:  Breathing rapidly.  Breathing very slowly.  Loud breathing.  Hearing "gurgling" lung sounds when a person breaths.  Coughing up phlegm (sputum) that is:  Yellow, tan, or green in color.  Has pieces of food in it.  Bad smelling.  A change in voice (hoarseness) or a "gurgly" sound to the voice.  A change in skin color. The skin may turn red, or a "bluish" type color because of a lack of oxygen (cyanosis).  Fever.  Eyes watering.  Pain in the chest or back.  Facial grimacing .  A feeling of fullness in the throat or that something is stuck in the  throat. DIAGNOSIS  A chest X-ray may be performed. This takes a picture of your lungs. It can show changes in the lungs if aspiration has occurred.  A bronchoscopy may be performed. This is a surgical procedure in which a thin, flexible tube with a camera at the end is inserted into the nose or mouth. The tube is advanced to the lungs so your health care provider can view the lungs and obtain a culture, tissue sample, or remove an aspirated object.  A swallowing evaluation study may be performed to evaluate:  A person's risk of aspiration.  How difficult it is for a person to swallow.  What types of foods are safe for a person to eat. PREVENTION If you are a caregiver to someone who may aspirate, follow the directions below. If you are caring for someone who can eat and drink through their mouth:  Have them sit in an upright position when eating food or drinking fluids, such as:  Sitting up in a chair.  If sitting in a chair is not possible, position the person in bed so they are upright.  Remind the person to eat slowly and chew well.  Do not distract the person. This is especially important for people with thinking or memory (cognitive) problems.  Check the person's mouth for leftover food after eating.  Keep the person sitting upright for 30 to 45 minutes after eating.  Do not serve food or drink for at least 2 hours before bedtime. If you are caring for someone with a feeding tube and he or she  cannot eat or drink through their mouth:  Keep the person in an upright position as much as possible.  Do not  lay the person flat if they are getting continuous feedings. Turn the feeding pump off if you need to lay the person flat for any reason.  Check feeding tube residuals as directed by your health care provider. If a large amount of tube feedings are pulled back (aspirated) from the feeding tube, call your health care provider right away. General guidelines to prevent  aspiration include:  Feed small amounts of food. Do not force feed.  Use as little water as possible when brushing the person's teeth or cleaning his or her mouth.  Provide oral care before and after meals.  Never put food or fluids in the mouth of a person who is not fully alert.  Crush pills and put them in soft food such as pudding or ice cream. Some pills should not be crushed. Check with your health care provider before crushing any medication. SEEK IMMEDIATE MEDICAL CARE IF:   The person has trouble breathing or starts to breathe rapidly.  The person is breathing very slowly or stops breathing.  The person coughs a lot after eating or drinking.  The person has a chronic cough.  The person coughs up thick, yellow, or tan sputum.  The person has a fever or persistent symptoms for more than 72 hours.  The person has a fever and their symptoms suddenly get worse. Document Released: 10/13/2010 Document Revised: 09/15/2013 Document Reviewed: 12/16/2013 [facility] Patient Information 2015 [place], [place]. This information is not intended to replace advice given to you by your health care provider. Make sure you discuss any questions you have with your health care provider.  Cough, Adult  A cough is a reflex. It helps you clear your throat and airways. A cough can help heal your body. A cough can last 2 or 3 weeks (acute) or may last more than 8 weeks (chronic). Some common causes of a cough can include an infection, allergy, or a cold. HOME CARE  Only take medicine as told by your doctor.  If given, take your medicines (antibiotics) as told. Finish them even if you start to feel better.  Use a cold steam vaporizer or humidifier in your home. This can help loosen thick spit (secretions).  Sleep so you are almost sitting up (semi-upright). Use pillows to do this. This helps reduce coughing.  Rest as needed.  Stop smoking if you smoke. GET HELP RIGHT AWAY IF:  You have  yellowish-white fluid (pus) in your thick spit.  Your cough gets worse.  Your medicine does not reduce coughing, and you are losing sleep.  You cough up blood.  You have trouble breathing.  Your pain gets worse and medicine does not help.  You have a fever. MAKE SURE YOU:   Understand these instructions.  Will watch your condition.  Will get help right away if you are not doing well or get worse. Document Released: 05/24/2011 Document Revised: 01/25/2014 Document Reviewed: 05/24/2011 [facility] Patient Information 2015 [place], [place]. This information is not intended to replace advice given to you by your health care provider. Make sure you discuss any questions you have with your health care provider.  Hemoptysis Hemoptysis means coughing up blood. The blood may come from the lungs and airways. It can also come from bleeding that occurs outside the lungs and airways. Coughing up blood can be a sign of a minor problem or a  serious medical condition.  HOME CARE  Only take medicine as told by your doctor. Do not use medicines that help you stop coughing (cough suppressants) unless your doctor approves.  If you are given antibiotic medicine, take it as told. Finish it even if you start to feel better.  Do not smoke. Also avoid being around others when they are smoking.  Follow up with your doctor as told. GET HELP RIGHT AWAY IF:  You cough up bloody spit (mucus) for longer than a week.  You have a blood-producing cough that is severe or getting worse.  You have a blood-producing cough thatcomes and goes over time.  You have trouble breathing.   You throw up (vomit) blood.  You have bloody or black poop (stool).  You have chest pain.   You have night sweats.  You feel faint or pass out.   You have a fever or lasting symptoms for more than 2-3 days.  You have a fever and your symptoms suddenly get worse. MAKE SURE YOU:  Understand these instructions.  Will  watch your condition.  Will get help right away if you are not doing well or get worse. Document Released: 08/27/2012 Document Reviewed: 08/27/2012 [facility] Patient Information 2015 [place], [place]. This information is not intended to replace advice given to you by your health care provider. Make sure you discuss any questions you have with your health care provider.  Sarcoidosis, Schaumann's Disease, Sarcoid of [place] Sarcoidosis appears briefly and heals naturally in 60 to 70 percent of cases, often without the patient knowing or doing anything about it. 20 to 30 percent of patients with sarcoidosis are left with some permanent lung damage. In 10 to 15 percent of the patients, sarcoidosis can become chronic (long lasting). When either the granulomas or fibrosis seriously affect the function of a vital organ (lungs, heart, nervous system, liver, or kidneys), sarcoidosis can be fatal. This occurs 5 to 10 percent of the time. No one can predict how sarcoidosis will progress in an individual patient. The symptoms the patient experiences, the caregiver's findings, and the patient's race can give some clues. Sarcoidosis was once considered a rare disease. We now know that it is a common chronic illness that appears all over the world. It is the most common of the fibrotic (scarring) lung disorders. Anyone can get sarcoidosis. It occurs in all races and in both sexes. The risk is greater if you are a young black adult, especially a black woman, or are of [place], [place], [place], or [place] origin. In sarcoidosis, small lumps (also called nodules or granulomas) develop in multiple organs of the body. These granulomas are small collections of inflamed cells. They commonly appear in the lungs. This is the most common organ affected. They also occur in the lymph nodes (your glands), skin, liver, and eyes. The granulomas vary in the amount of disease they produce from very little with no problems (symptoms)  to causing severe illness. The cause of sarcoidosis is not known. It may be due to an abnormal immune reaction in the body. Most people will recover. A few people will develop long lasting conditions that may get worse. Women are affected more often than men. The majority of those affected are under forty years of age. Because we do not know the cause, we do not have ways to prevent it. SYMPTOMS   Fever.  Loss of appetite.  Night sweats.  Joint pain.  Aching muscles Symptoms vary because the disease affects different  parts of the body in different people. Most people who see their caregiver with sarcoidosis have lung problems. The first signs are usually a dry cough and shortness of breath. There may also be wheezing, chest pain, or a cough that brings up bloody mucus. In severe cases, lung function may become so poor that the person cannot perform even the simple routine tasks of daily life. Other symptoms of sarcoidosis are less common than lung symptoms. They can include:  Skin symptoms. Sarcoidosis can appear as a collection of tender, red bumps called erythema nodosum. These bumps usually occur on the face, shins, and arms. They can also occur as a scaly, purplish discoloration on the nose, cheeks, and ears. This is called lupus pernio. Less often, sarcoidosis causes cysts, pimples, or disfiguring over growths of skin. In many cases, the disfiguring over growths develop in areas of scars or tattoos.  Eye symptoms. These include redness, eye pain, and sensitivity to light.  Heart symptoms. These include irregular heartbeat and heart failure.  Other symptoms. A person may have paralyzed facial muscles, seizures, psychiatric symptoms, swollen salivary glands, or bone pain. DIAGNOSIS  Even when there are no symptoms, your caregiver can sometimes pick up signs of sarcoidosis during a routine examination, usually through a chest x-ray or when checking other complaints. The patient's age and  race or ethnic group can raise an additional red flag that a sign or symptom could be related to sarcoidosis.   Enlargement of the salivary or tear glands and cysts in bone tissue may also be caused by sarcoidosis.  You may have had a biopsy done that shows signs of sarcoidosis. A biopsy is a small tissue sample that is removed for laboratory testing. This tissue sample can be taken from your lung, skin, lip, or another inflamed or abnormal area of the body.  You may have had an abnormal chest X-ray. Although you appear healthy, a chest X-ray ordered for other reasons may turn up abnormalities that suggest sarcoidosis.  Other tests may be needed. These tests may be done to rule out other illnesses or to determine the amount of organ damage caused by sarcoidosis. Some of the most common tests are:  Blood levels of calcium or angiotensin-converting enzyme may be high in people with sarcoidosis.  Blood tests to evaluate how well your liver is functioning.  Lung function tests to measure how well you are breathing.  A complete eye examination. TREATMENT  If sarcoidosis does not cause any problems, treatment may not be necessary. Your caregiver may decide to simply monitor your condition. As part of this monitoring process, you may have frequent office visits, follow-up chest X-rays, and tests of your lung function.If you have signs of moderate or severe lung disease, your doctor may recommend:  A corticosteroid drug, such as prednisone (sold under several brand names).  Corticosteroids also are used to treat sarcoidosis of the eyes, joints, skin, nerves, or heart.  Corticosteroid eye drops may be used for the eyes.  Over-the-counter medications like nonsteroidal anti-inflammatory drugs (NSAID) often are used to treat joint pain first before corticosteroids, which tend to have more side effects.  If corticosteroids are not effective or cause serious side effects, other drugs that alter or  suppress the immune system may be used.  In rare cases, when sarcoidosis causes life-threatening lung disease, a lung transplant may be necessary. However, there is some risk that the new lungs also will be attacked by sarcoidosis. SEEK IMMEDIATE MEDICAL CARE IF:  You suffer from shortness of breath or a lingering cough.  You develop new problems that may be related to the disease. Remember this disease can affect almost all organs of the body and cause many different problems. Document Released: 07/11/2004 Document Revised: 12/03/2011 Document Reviewed: 01/06/2014 [facility] Patient Information 2015 [place], [place]. This information is not intended to replace advice given to you by your health care provider. Make sure you discuss any questions you have with your health care provider.

## 2014-06-14 NOTE — Progress Notes (Signed)
Patient Name: Tony Wise Date of Encounter: 06/14/2014     Active Problems:   Nausea and vomiting   Chest pain   Acute respiratory failure   Hemoptysis   Elevated troponin   GERD (gastroesophageal reflux disease)   Hypertension   Shortness of breath   NSTEMI (non-ST elevated myocardial infarction)    SUBJECTIVE  CP resolved, states he only had the R sided pleuritic CP during cough. No further hemoptysis. No SOB. Feeling well this morning   CURRENT MEDS . antiseptic oral rinse  7 mL Mouth Rinse BID  . fluticasone  1 spray Each Nare Daily  . hydroxychloroquine  200 mg Oral BID  . Influenza vac split quadrivalent PF  0.5 mL Intramuscular Tomorrow-1000  . ipratropium-albuterol  3 mL Nebulization Q6H  . methylPREDNISolone (SOLU-MEDROL) injection  60 mg Intravenous Q24H  . pantoprazole (PROTONIX) IV  40 mg Intravenous Q12H  . sodium chloride  3 mL Intravenous Q12H    OBJECTIVE  Filed Vitals:   06/13/14 2100 06/13/14 2114 06/14/14 0217 06/14/14 0500  BP: 120/79   104/66  Pulse: 79 81 66 77  Temp: 97.5 F (36.4 C)   97.8 F (36.6 C)  TempSrc:      Resp: Height:      Weight:    180 lb (81.647 kg)  SpO2: 97% 97% 94% 96%    Intake/Output Summary (Last 24 hours) at 06/14/14 0814 Last data filed at 06/13/14 2300  Gross per 24 hour  Intake 1001.67 ml  Output    825 ml  Net 176.67 ml   Filed Weights   06/13/14 0100 06/14/14 0500  Weight: 182 lb 12.2 oz (82.9 kg) 180 lb (81.647 kg)    PHYSICAL EXAM  General: Pleasant, NAD. Neuro: Alert and oriented X 3. Moves all extremities spontaneously. Psych: Normal affect. HEENT:  Normal  Neck: Supple without bruits or JVD. Lungs:  Resp regular and unlabored, CTA with mild wheezing Heart: RRR no s3, s4, or murmurs. Abdomen: Soft, non-tender, non-distended, BS + x 4.  Extremities: No clubbing, cyanosis or edema. DP/PT/Radials 2+ and equal bilaterally.  Accessory Clinical Findings  CBC  Recent Labs  06/12/14 1530 06/13/14 0142  WBC 7.5 6.9  HGB 14.0 13.6  HCT 41.9 40.4  MCV 79.7 79.2  PLT 249 241   Basic Metabolic Panel  Recent Labs  06/12/14 1530 06/13/14 0142  NA 141 139  K 3.9 4.3  CL 100 101  CO2 27 25  GLUCOSE 169* 121*  BUN 13 13  CREATININE 1.41* 1.34  CALCIUM 9.9 9.6   Liver Function Tests  Recent Labs  06/13/14 0142  AST 23  ALT 12  ALKPHOS 102  BILITOT 0.9  PROT 7.6  ALBUMIN 3.7   Cardiac Enzymes  Recent Labs  06/12/14 1530 06/12/14 1917 06/13/14 0142 06/13/14 0645  CKTOTAL  --  71 97 89  CKMB  --  4.2*  --   --   TROPONINI 0.83* 2.11* 1.13* 0.72*   Hemoglobin A1C  Recent Labs  06/13/14 0142  HGBA1C 6.0*   Fasting Lipid Panel  Recent Labs  06/13/14 0142  CHOL 200  HDL 37*  LDLCALC 138*  TRIG 123  CHOLHDL 5.4    TELE NSR with HR 70s, no significant ventricular ectopy    ECG  NSR with RBBB  Echocardiogram  LV EF: 55% - 60%  ------------------------------------------------------------------- Indications: Chest pain 786.51. Shortness of breath 786.05.  ------------------------------------------------------------------- History: Risk factors: Hypertension.  -------------------------------------------------------------------  Study Conclusions  - Left ventricle: No obvious wall motion abnormality but the endocardium is not adequately seen in the 3 chamber view to accurately assess. LV global strain appears reduced at -16.9%. Recommend repeat limited study with Definity contrast to evaluate further. The cavity size was normal. Systolic function was normal. The estimated ejection fraction was in the range of 55% to 60%. Images were inadequate for LV wall motion assessment. There was an increased relative contribution of atrial contraction to ventricular filling. Left ventricular diastolic function parameters were normal. - Atrial septum: No defect or patent foramen ovale was identified. - Tricuspid valve: There  was trivial regurgitation.      Radiology/Studies  Ct Chest Wo Contrast  06/12/2014   CLINICAL DATA:  Chest pain. Shortness of breath. Hemoptysis. Abnormal chest x-ray earlier today.  EXAM: CT CHEST WITHOUT CONTRAST  TECHNIQUE: Multidetector CT imaging of the chest was performed following the standard protocol without IV contrast.  COMPARISON:  No prior CT.  Portable chest x-ray earlier same date.  FINDINGS: Innumerable nodules throughout both lungs. Bronchiectasis and scarring associated with parenchymal calcification involving both upper lobes, accounting for the opacity on the chest x-ray. Emphysematous changes in both lungs. Small left pleural effusion. Enlarged calcified lymph nodes throughout the mediastinum and in both hila, the largest conglomerate nodes measuring approximately 3.2 x 6.1 cm in the subcarinal region (station 7).  Normal heart size. Mild LAD and left circumflex coronary atherosclerosis. Mild atherosclerosis involving the thoracic and upper abdominal aorta without aneurysm.  Bone window images demonstrate spondylosis at T8-9 and perhaps mild osseous demineralization. Visualized upper abdomen unremarkable for the unenhanced technique.  IMPRESSION: 1. 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 This is typically seen in patients with dust inhalational pneumoconioses such as coal workers pneumoconiosis, silicosis and occasionally talcosis. 2. Mediastinal and bilateral hilar lymphadenopathy with calcifications as part of the same pathologic process.   Electronically Signed   By: Hulan Saas M.D.   On: 06/12/2014 17:01   Ct Angio Chest W/cm &/or Wo Cm  06/12/2014   CLINICAL DATA:  Sudden onset of nausea. Vomiting bright red blood. Chest pain. History of sarcoidosis and COPD.  EXAM: CT ANGIOGRAPHY CHEST WITH  CONTRAST  TECHNIQUE: Multidetector CT imaging of the chest was performed using the standard protocol during bolus administration of intravenous contrast. Multiplanar CT image reconstructions and MIPs were obtained to evaluate the vascular anatomy.  CONTRAST:  OMNIPAQUE IOHEXOL 350 MG/ML SOLN  COMPARISON:  06/12/2014 at 1642 hr.  FINDINGS: No evidence of a pulmonary embolus.  Heart is normal in size.  Minor coronary artery calcifications.  There is bulky partly calcified mediastinal adenopathy as well as bulky hilar adenopathy also partly calcified. Hilar adenopathy is contiguous with progressive massive fibrosis and associated calcifications in both upper lobes.  There are numerous small pulmonary nodules many with calcifications.  Bulky fibrosis narrows the upper lobe pulmonary arteries and bronchi. Significant fibrosis and volume loss is noted in the right middle lobe, also with areas of bronchial narrowing.  No pleural effusion.  No pneumothorax.  Limited evaluation of the upper abdomen is unremarkable.  No osteoblastic or osteolytic lesions.  Review of the MIP images confirms the above findings.  IMPRESSION: 1. No evidence of a pulmonary embolism. 2. As seen on the study obtained earlier the same date, there is progressive massive fibrosis, numerous pulmonary  nodules, bulky mediastinal and hilar adenopathy, with scattered calcifications noted throughout the nodes in pulmonary nodules and upper lobe fibrosis. Although the history provided indicates sarcoidosis, other etiologies as mentioned in the early report including silicosis should be considered. 3. There are areas of bronchial and pulmonary artery narrowing mostly in the upper lobes. The possibility of the fibrosis eroding into a bronchus in leading to bleeding should be considered given the history.   Electronically Signed   By: Amie Portland M.D.   On: 06/12/2014 19:54   Dg Chest Portable 1 View  06/12/2014   CLINICAL DATA:  Right-sided chest  pain and shortness of breath. Nausea. History of COPD and sarcoid.  EXAM: PORTABLE CHEST - 1 VIEW  COMPARISON:  None.  FINDINGS: Cardiac and mediastinal contours are largely obscured due to overlying opacities. Upper retraction of the hila bilaterally. There is large consolidative opacity within the right greater than left upper lungs. Diffuse bilateral scattered nodules. No pleural effusion. No definite pneumothorax. Low lung volumes. Regional skeleton is unremarkable. Relative lucency under left hemidiaphragm.  IMPRESSION: Right-greater-than-left upper lung large consolidative pulmonary opacities as well as diffuse bilateral pulmonary nodules. In the absence of priors for comparison, findings may be secondary to sarcoidosis and associated progressive massive fibrosis. Underlying pulmonary malignancy or superimposed infectious process are additional considerations. Correlation with outside priors would prove helpful.  Small right pleural effusion versus thickening.  Relative lucency under left hemidiaphragm favored to represent gas within the colon. If there is concern for intra-abdominal pathology, can correlate with decubitus views to exclude free air.  These results were called by telephone at the time of interpretation on 06/12/2014 at 4:20 pm to [facility] , who verbally acknowledged these results.   Electronically Signed   By: Annia Belt M.D.   On: 06/12/2014 16:24    ASSESSMENT AND PLAN  1. Demand ischemia with elevated troponin due to Nausea/Vomiting, hemoptysis, and respiratory distress.  - EKG - RBBB/LAFB  - CT shows mild coronary calcification, reportedly had normal cath in 2011 at [facility]  - likely demand ischemia in the setting of severe vomiting and respiratory issue, per Dr. Mayford Knife, likely not ACS  - Echo 9/19 EF 55-60%, recommend repeat limited echo with Definity contrast to assess wall motion  - if wall motion normal, likely does not require any further cardiac workup  2. Severe COPD 3.  End stage Sarcoidosis per Radiology 4. GERD 5. Dyslipidemia 6. RBBB  Signed, Azalee Course PA-C Pager: [phone] Patient seen and examined and history reviewed. Agree with above findings and plan. Awaiting repeat limited Echo with contrast today to better assess LV function. No further cardiac work up or treatment planned. Not a candidate for ASA due to hemoptysis. Not a candidate for beta blocker due to lung disease.   Naama [place]ton [place], MDFACC 06/14/2014 10:35 AM

## 2014-06-14 NOTE — Progress Notes (Signed)
Utilization review completed.  

## 2014-06-14 NOTE — Progress Notes (Addendum)
PULMONARY  / CRITICAL CARE MEDICINE CONSULTATION  Name: Tony Wise MRN: [id number] DOB: 06/03/1956  PHYSICIAN REQUESTING CONSULT: Andreas Blower  REASON FOR CONSULT: hemoptysis and sarcoidosis    ADMISSION DATE:  06/12/2014  CHIEF COMPLAINT:  Coughing up blood  BRIEF PATIENT DESCRIPTION:  58 yo male from [place] presented to [facility] after ?vomiting/hemoptysis.  He has hx of sarcoidosis and followed at [facility], and has hx of Nissen Fundoplication.  STUDIES:  9/19 Echo >> EF 55 to 60% 9/19 CT chest >> PMF with calcifications and BTX, multiple nodules, mediastinal/hilar LAN with calcifications  SUBJECTIVE: No further episodes of ?coughing or vomiting blood.  Denies chest pain.  Feels hungry.  Anxious to go home.  VITAL SIGNS: Temp:  [97.5 F (36.4 C)-98 F (36.7 C)] 97.8 F (36.6 C) (09/21 0500) Pulse Rate:  [66-102] 72 (09/21 0824) Resp:  [16-20] 18 (09/21 0824) BP: (104-120)/(66-79) 104/66 mmHg (09/21 0500) SpO2:  [94 %-97 %] 96 % (09/21 0500) Weight:  [180 lb (81.647 kg)] 180 lb (81.647 kg) (09/21 0500)  PHYSICAL EXAM General:  WDWN male lying in bed, NAD Neuro:  No focal deficits; moves all extremities; follows commands; alert and oriented x4 HEENT:  Eyes anicteric; no evidence of dry blood in nares or oral cavity Neck:  Supple, nontender. No lymphadenopathy.  Trachea midline. No JVD. Cardiovascular:  RRR, no MRG. 2+ peripheral pulses Lungs:  Diffuse expiratory wheezing. Prolonged expiratory phase. Accentuated upper airway sounds over trachea but no overt stridor Abdomen:  Soft, nontender, nondistended.  Non-palpable liver and spleen.  Musculoskeletal:  No joint abnormalities; no clubbing. Normal muscle tone/bulk Skin:  No rashes or lesions or skin breakdown noted  INS/OUTS: Intake/Output     09/20 0701 - 09/21 0700 09/21 0701 - 09/22 0700   P.O. 840    I.V. (mL/kg) 261.7 (3.2)    Total Intake(mL/kg) 1101.7 (13.5)    Urine (mL/kg/hr) 625 (0.3)    Other 200 (0.1)     Total Output 825     Net +276.7          Urine Occurrence 1 x      CLINICAL DECISION MAKING  CBC Recent Labs     06/12/14  1530  06/13/14  0142  WBC  7.5  6.9  HGB  14.0  13.6  HCT  41.9  40.4  PLT  249  241    BMET Recent Labs     06/12/14  1530  06/13/14  0142  NA  141  139  K  3.9  4.3  CL  100  101  CO2  27  25  BUN  13  13  CREATININE  1.41*  1.34  GLUCOSE  169*  121*    Electrolytes Recent Labs     06/12/14  1530  06/13/14  0142  CALCIUM  9.9  9.6    Liver Enzymes Recent Labs     06/13/14  0142  AST  23  ALT  12  ALKPHOS  102  BILITOT  0.9  ALBUMIN  3.7    Cardiac Enzymes Recent Labs     06/12/14  1917  06/13/14  0142  06/13/14  0645  TROPONINI  2.11*  1.13*  0.72*    Glucose Recent Labs     06/13/14  0147  GLUCAP  119*    Imaging Ct Chest Wo Contrast  06/12/2014   CLINICAL DATA:  Chest pain. Shortness of breath. Hemoptysis. Abnormal chest x-ray earlier today.  EXAM: CT CHEST  WITHOUT CONTRAST  TECHNIQUE: Multidetector CT imaging of the chest was performed following the standard protocol without IV contrast.  COMPARISON:  No prior CT.  Portable chest x-ray earlier same date.  FINDINGS: Innumerable nodules throughout both lungs. Bronchiectasis and scarring associated with parenchymal calcification involving both upper lobes, accounting for the opacity on the chest x-ray. Emphysematous changes in both lungs. Small left pleural effusion. Enlarged calcified lymph nodes throughout the mediastinum and in both hila, the largest conglomerate nodes measuring approximately 3.2 x 6.1 cm in the subcarinal region (station 7).  Normal heart size. Mild LAD and left circumflex coronary atherosclerosis. Mild atherosclerosis involving the thoracic and upper abdominal aorta without aneurysm.  Bone window images demonstrate spondylosis at T8-9 and perhaps mild osseous demineralization. Visualized upper abdomen unremarkable for the unenhanced technique.   IMPRESSION: 1. 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 This is typically seen in patients with dust inhalational pneumoconioses such as coal workers pneumoconiosis, silicosis and occasionally talcosis. 2. Mediastinal and bilateral hilar lymphadenopathy with calcifications as part of the same pathologic process.   Electronically Signed   By: Hulan Saas M.D.   On: 06/12/2014 17:01   Ct Angio Chest W/cm &/or Wo Cm  06/12/2014   CLINICAL DATA:  Sudden onset of nausea. Vomiting bright red blood. Chest pain. History of sarcoidosis and COPD.  EXAM: CT ANGIOGRAPHY CHEST WITH CONTRAST  TECHNIQUE: Multidetector CT imaging of the chest was performed using the standard protocol during bolus administration of intravenous contrast. Multiplanar CT image reconstructions and MIPs were obtained to evaluate the vascular anatomy.  CONTRAST:  OMNIPAQUE IOHEXOL 350 MG/ML SOLN  COMPARISON:  06/12/2014 at 1642 hr.  FINDINGS: No evidence of a pulmonary embolus.  Heart is normal in size.  Minor coronary artery calcifications.  There is bulky partly calcified mediastinal adenopathy as well as bulky hilar adenopathy also partly calcified. Hilar adenopathy is contiguous with progressive massive fibrosis and associated calcifications in both upper lobes.  There are numerous small pulmonary nodules many with calcifications.  Bulky fibrosis narrows the upper lobe pulmonary arteries and bronchi. Significant fibrosis and volume loss is noted in the right middle lobe, also with areas of bronchial narrowing.  No pleural effusion.  No pneumothorax.  Limited evaluation of the upper abdomen is unremarkable.  No osteoblastic or osteolytic lesions.  Review of the MIP images confirms the above findings.  IMPRESSION: 1. No evidence of a pulmonary embolism. 2. As  seen on the study obtained earlier the same date, there is progressive massive fibrosis, numerous pulmonary nodules, bulky mediastinal and hilar adenopathy, with scattered calcifications noted throughout the nodes in pulmonary nodules and upper lobe fibrosis. Although the history provided indicates sarcoidosis, other etiologies as mentioned in the early report including silicosis should be considered. 3. There are areas of bronchial and pulmonary artery narrowing mostly in the upper lobes. The possibility of the fibrosis eroding into a bronchus in leading to bleeding should be considered given the history.   Electronically Signed   By: Amie Portland M.D.   On: 06/12/2014 19:54   Dg Chest Portable 1 View  06/12/2014   CLINICAL DATA:  Right-sided chest pain and shortness of breath. Nausea. History of COPD and sarcoid.  EXAM: PORTABLE CHEST - 1 VIEW  COMPARISON:  None.  FINDINGS: Cardiac and mediastinal contours are largely obscured due to overlying opacities. Upper retraction  of the hila bilaterally. There is large consolidative opacity within the right greater than left upper lungs. Diffuse bilateral scattered nodules. No pleural effusion. No definite pneumothorax. Low lung volumes. Regional skeleton is unremarkable. Relative lucency under left hemidiaphragm.  IMPRESSION: Right-greater-than-left upper lung large consolidative pulmonary opacities as well as diffuse bilateral pulmonary nodules. In the absence of priors for comparison, findings may be secondary to sarcoidosis and associated progressive massive fibrosis. Underlying pulmonary malignancy or superimposed infectious process are additional considerations. Correlation with outside priors would prove helpful.  Small right pleural effusion versus thickening.  Relative lucency under left hemidiaphragm favored to represent gas within the colon. If there is concern for intra-abdominal pathology, can correlate with decubitus views to exclude free air.  These  results were called by telephone at the time of interpretation on 06/12/2014 at 4:20 pm to [facility] , who verbally acknowledged these results.   Electronically Signed   By: Annia Belt M.D.   On: 06/12/2014 16:24    ASSESSMENT / PLAN:  ?hemoptysis >> not clear that this was from his lungs. Hx of sarcoidosis with granulomatous mediastinitis. Hx of COPD. Plan: No indication for bronchoscopy at this time >> he can f/u with pulmonary at [facility] D/c solumedrol Resume prn combivent D/c plaquenil  ?hematemesis >> not evident since admission. Hx of GERD s/p Nissen fundoplication. Plan: Advance diet Continue BID protonix Will need f/u with PCP as outpt within next week to further assess  Elevated troponin >> seen by cardiology. Plan: If repeat Echo 9/21 okay, then no additional inpatient evaluation needed  Headaches. Plan: PRN tylenol   Social / Family:  Wife Okey Regal [phone]  Beckey Rutter, PA-S  [facility]  06/14/2014, 9:27 AM  Reviewed above, examined.  No further episodes of bleeding.  Difficulty to say where source is GI vs pulmonary.  He is very anxious to go back to [place].  Will arrange for d/c home later today.  Coralyn Helling, MD [facility] Pulmonary/Critical Care 06/14/2014, 3:11 PM Pager:  [phone] After 3pm call: [phone]

## 2015-01-25 IMAGING — CT CT CHEST W/O CM
2 of 3 series · 15 of 36 positions shown, 18 images · non-contrast
Comparison: No prior CT.  Portable chest x-ray earlier same date.

CLINICAL DATA: Chest pain. Shortness of breath. Hemoptysis.
Abnormal chest x-ray earlier today.

EXAM:
CT CHEST WITHOUT CONTRAST
TECHNIQUE: Multidetector CT imaging of the chest was performed following the
standard protocol without IV contrast..

[Series 2: thorax 5.0 i31f 1 · axial · 0.76mm/px · z∈[+1350,+1620]mm · 12 of 64 slices shown, 15 images]
[im 5/64  mediastinal]
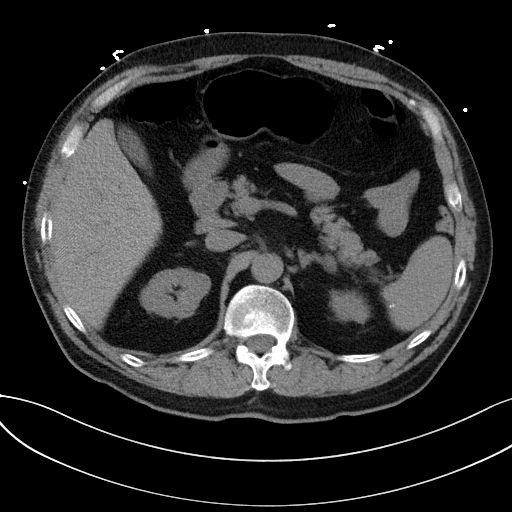
[im 5/64  lung]
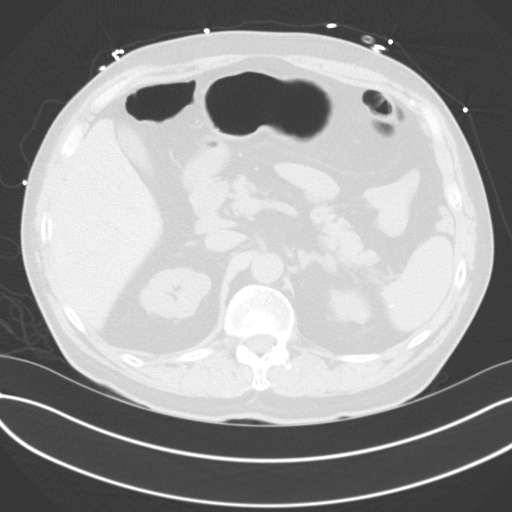
[im 10/64  lung]
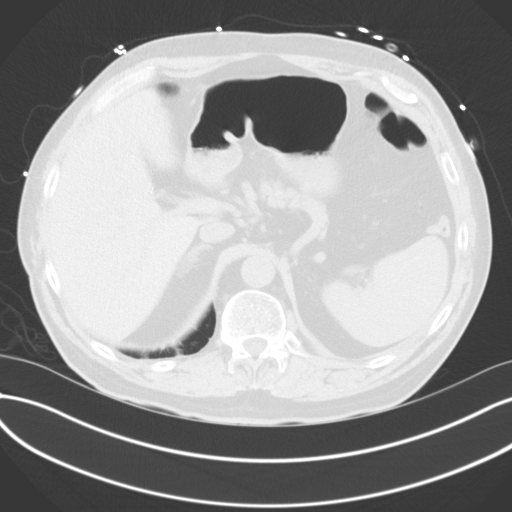
[im 15/64  lung]
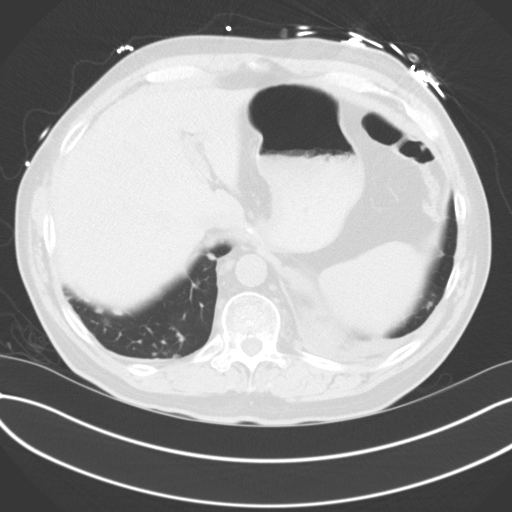
[im 19/64  lung]
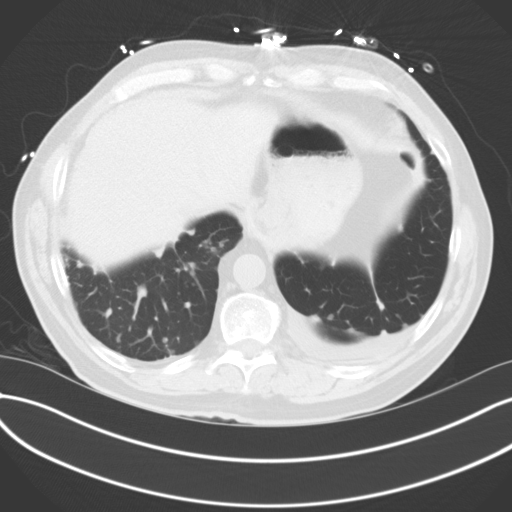
[im 24/64  mediastinal]
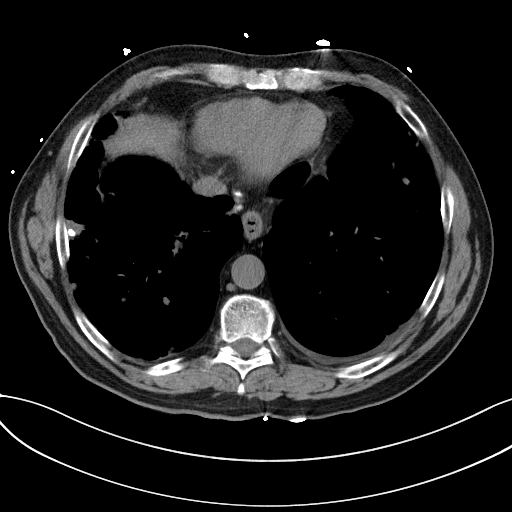
[im 24/64  lung]
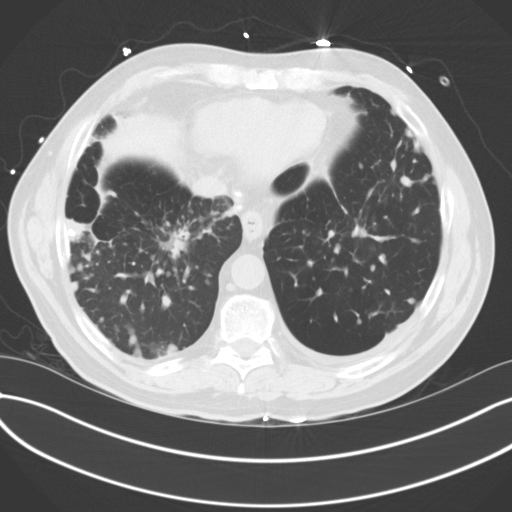
[im 29/64  lung]
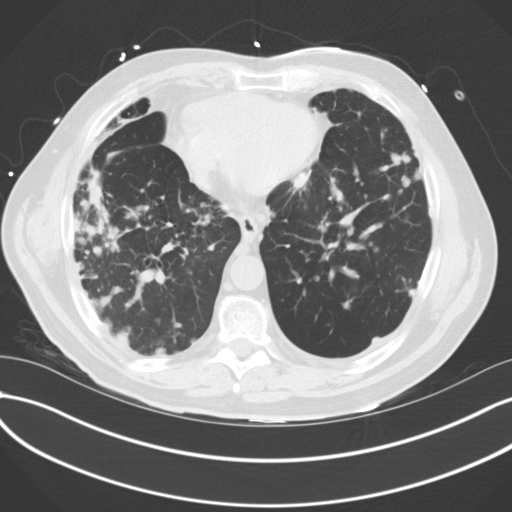
[im 36/64  lung]
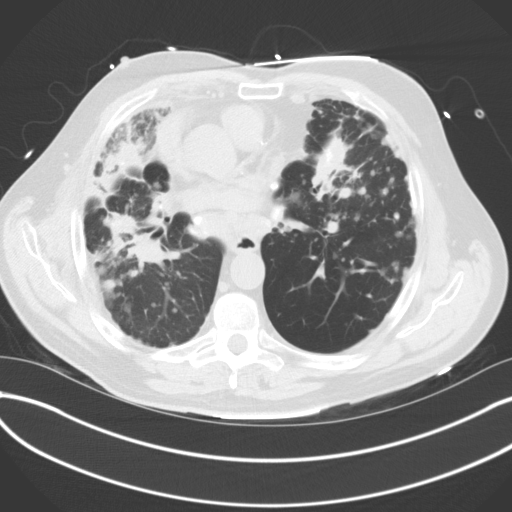
[im 40/64  lung]
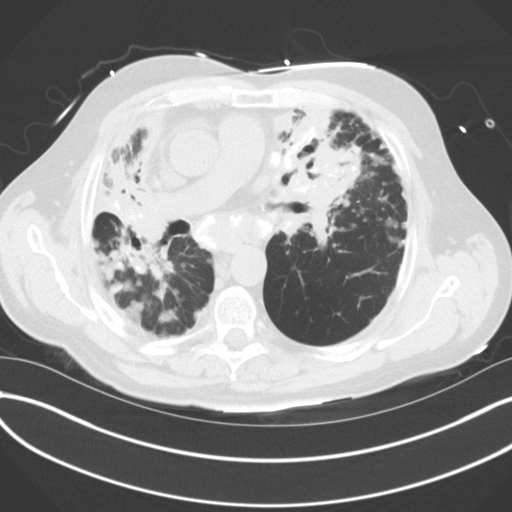
[im 45/64  mediastinal]
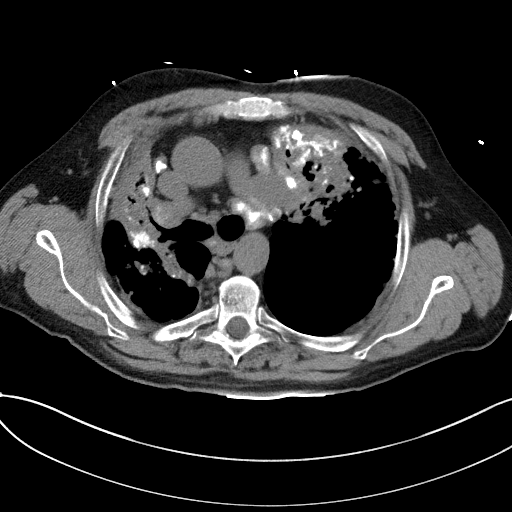
[im 45/64  lung]
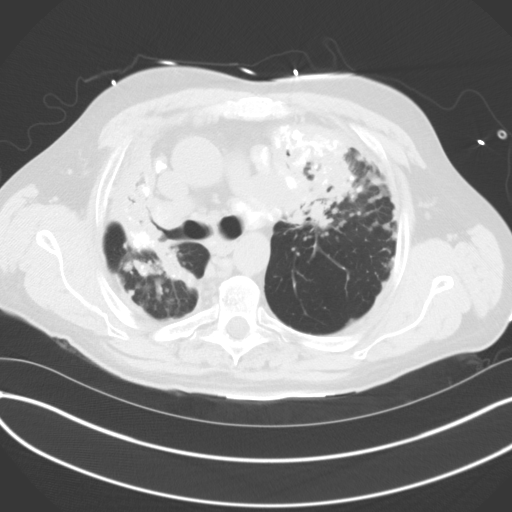
[im 50/64  lung]
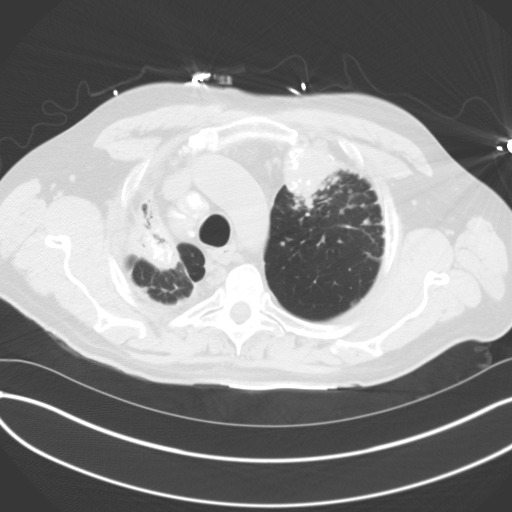
[im 54/64  lung]
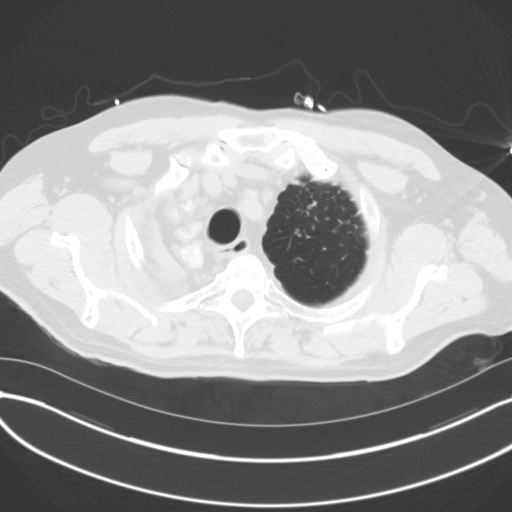
[im 59/64  lung]
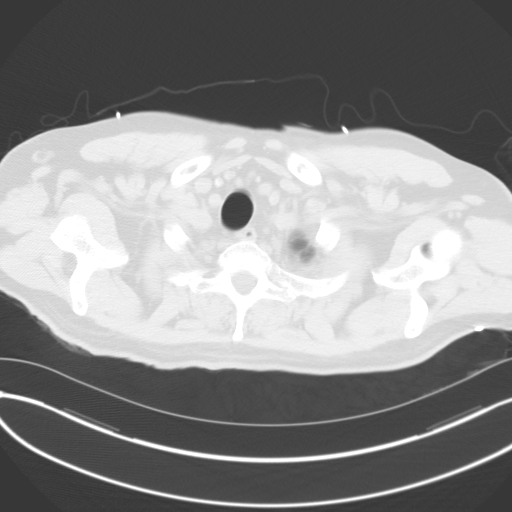

[Series 5: coronal · coronal · 0.62mm/px · 3 of 94 slices shown]
[im 19/94  lung]
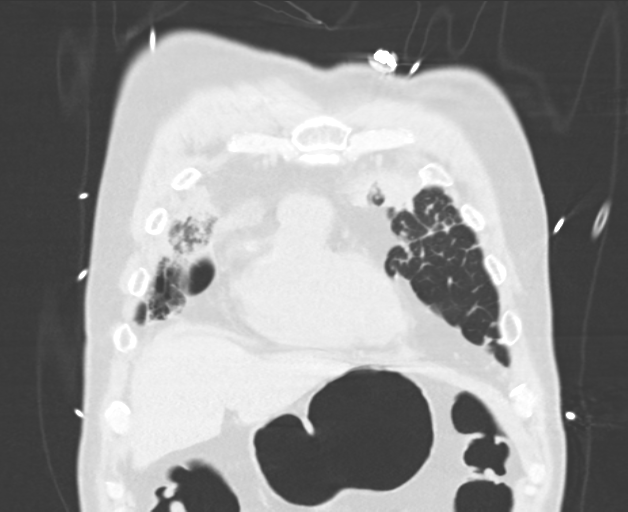
[im 38/94  lung]
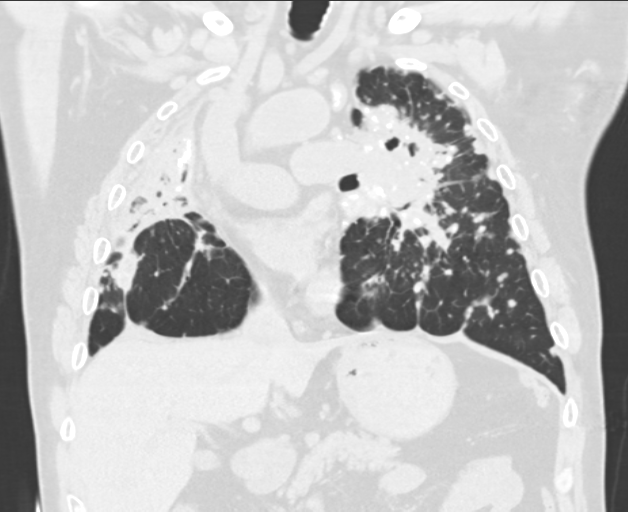
[im 56/94  lung]
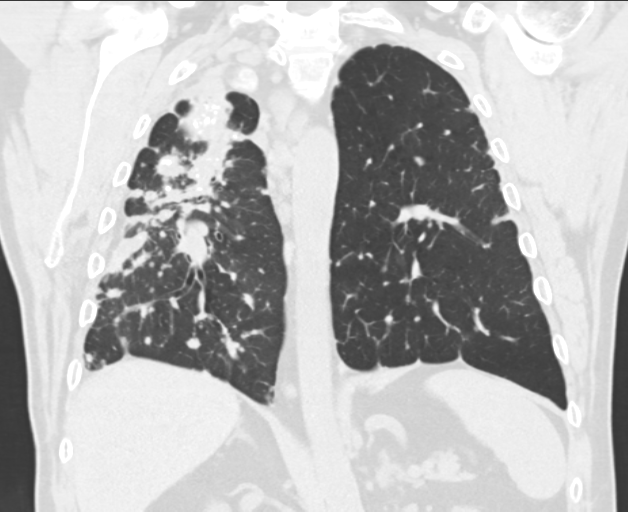

[15 of 36 positions shown; findings below may reference images not displayed]

FINDINGS: Innumerable nodules throughout both lungs. Bronchiectasis and
scarring associated with parenchymal calcification involving both
upper lobes, accounting for the opacity on the chest x-ray.
Emphysematous changes in both lungs. Small left pleural effusion.
Enlarged calcified lymph nodes throughout the mediastinum and in
both hila, the largest conglomerate nodes measuring approximately
3.2 x 6.1 cm in the subcarinal region (station 7).

Normal heart size. Mild LAD and left circumflex coronary
atherosclerosis. Mild atherosclerosis involving the thoracic and
upper abdominal aorta without aneurysm.

Bone window images demonstrate spondylosis at T8-9 and perhaps mild
osseous demineralization. Visualized upper abdomen unremarkable for
the unenhanced technique.
IMPRESSION: 1. Severe progressive massive fibrosis (PMF) with associated
calcifications and bronchiectasis in the upper lobes, along with
innumerable nodules throughout both lungs, accounting for the
opacities on the earlier chest x-ray. This is typically seen in
patients with dust inhalational pneumoconioses such as coal workers
pneumoconiosis, silicosis and occasionally talcosis.
2. Mediastinal and bilateral hilar lymphadenopathy with
calcifications as part of the same pathologic process.

## 2015-10-07 ENCOUNTER — Other Ambulatory Visit: Payer: Self-pay | Admitting: Cardiology

## 2015-10-07 NOTE — Telephone Encounter (Signed)
°*  STAT* If patient is at the pharmacy, call can be transferred to refill team.   1. Which medications need to be refilled? (please list name of each medication and dose if known) Simvastatin  2. Which pharmacy/location (including street and city if local pharmacy) is medication to be sent to?[facility]-[phone]  3. Do they need a 30 day or 90 day supply? 30 and refills

## 2015-10-10 NOTE — Telephone Encounter (Signed)
rx refused. Patient has not been seen by cardiology since hospitalization in 2015. Called pharmacy to notify them.

## 2019-02-05 NOTE — Progress Notes (Signed)
COVID Hotel Screening performed. Temperature, PHQ-9, and need for medical care and medications assessed. No additional needs assessed at this time.  Amit Leece RN MSN 

## 2022-12-24 DEATH — deceased
